# Patient Record
Sex: Female | Born: 1980 | Race: White | Hispanic: No | Marital: Married | Smoking: Former smoker
Health system: Southern US, Community
[De-identification: ages and names within clinical notes are randomized; demographics above are authoritative.]

## PROBLEM LIST (undated history)

## (undated) ENCOUNTER — Inpatient Hospital Stay (HOSPITAL_COMMUNITY): Payer: Self-pay

## (undated) DIAGNOSIS — O30043 Twin pregnancy, dichorionic/diamniotic, third trimester: Secondary | ICD-10-CM

## (undated) DIAGNOSIS — Z9289 Personal history of other medical treatment: Secondary | ICD-10-CM

## (undated) DIAGNOSIS — K219 Gastro-esophageal reflux disease without esophagitis: Secondary | ICD-10-CM

## (undated) DIAGNOSIS — Z8659 Personal history of other mental and behavioral disorders: Secondary | ICD-10-CM

## (undated) DIAGNOSIS — O4290 Premature rupture of membranes, unspecified as to length of time between rupture and onset of labor, unspecified weeks of gestation: Secondary | ICD-10-CM

## (undated) DIAGNOSIS — J45909 Unspecified asthma, uncomplicated: Secondary | ICD-10-CM

## (undated) DIAGNOSIS — R112 Nausea with vomiting, unspecified: Secondary | ICD-10-CM

## (undated) DIAGNOSIS — Z973 Presence of spectacles and contact lenses: Secondary | ICD-10-CM

## (undated) DIAGNOSIS — F319 Bipolar disorder, unspecified: Secondary | ICD-10-CM

## (undated) DIAGNOSIS — Z87442 Personal history of urinary calculi: Secondary | ICD-10-CM

## (undated) DIAGNOSIS — F419 Anxiety disorder, unspecified: Secondary | ICD-10-CM

## (undated) DIAGNOSIS — O34219 Maternal care for unspecified type scar from previous cesarean delivery: Secondary | ICD-10-CM

## (undated) DIAGNOSIS — Z9889 Other specified postprocedural states: Secondary | ICD-10-CM

---

## 1998-01-23 ENCOUNTER — Encounter: Payer: Self-pay | Admitting: Emergency Medicine

## 1998-01-23 ENCOUNTER — Emergency Department (HOSPITAL_COMMUNITY): Admission: EM | Admit: 1998-01-23 | Discharge: 1998-01-23 | Payer: Self-pay | Admitting: Emergency Medicine

## 1998-03-18 ENCOUNTER — Inpatient Hospital Stay (HOSPITAL_COMMUNITY): Admission: EM | Admit: 1998-03-18 | Discharge: 1998-03-21 | Payer: Self-pay | Admitting: Emergency Medicine

## 1998-03-18 ENCOUNTER — Encounter: Payer: Self-pay | Admitting: Family Medicine

## 1998-03-19 ENCOUNTER — Encounter: Payer: Self-pay | Admitting: Family Medicine

## 1998-03-20 ENCOUNTER — Encounter: Payer: Self-pay | Admitting: Family Medicine

## 1998-03-25 ENCOUNTER — Encounter: Admission: RE | Admit: 1998-03-25 | Discharge: 1998-03-25 | Payer: Self-pay | Admitting: Family Medicine

## 1998-03-27 ENCOUNTER — Encounter: Payer: Self-pay | Admitting: *Deleted

## 1998-03-27 ENCOUNTER — Ambulatory Visit (HOSPITAL_COMMUNITY): Admission: RE | Admit: 1998-03-27 | Discharge: 1998-03-27 | Payer: Self-pay | Admitting: *Deleted

## 1999-07-29 ENCOUNTER — Encounter: Payer: Self-pay | Admitting: Orthodontics and Dentofacial Orthopedics

## 1999-07-29 ENCOUNTER — Ambulatory Visit (HOSPITAL_COMMUNITY)
Admission: RE | Admit: 1999-07-29 | Discharge: 1999-07-29 | Payer: Self-pay | Admitting: Orthodontics and Dentofacial Orthopedics

## 2000-06-02 ENCOUNTER — Encounter: Admission: RE | Admit: 2000-06-02 | Discharge: 2000-08-31 | Payer: Self-pay

## 2000-06-15 ENCOUNTER — Encounter: Admission: RE | Admit: 2000-06-15 | Discharge: 2000-06-15 | Payer: Self-pay

## 2001-12-23 ENCOUNTER — Emergency Department (HOSPITAL_COMMUNITY): Admission: EM | Admit: 2001-12-23 | Discharge: 2001-12-24 | Payer: Self-pay | Admitting: *Deleted

## 2002-02-10 ENCOUNTER — Encounter: Payer: Self-pay | Admitting: Emergency Medicine

## 2002-02-10 ENCOUNTER — Emergency Department (HOSPITAL_COMMUNITY): Admission: EM | Admit: 2002-02-10 | Discharge: 2002-02-10 | Payer: Self-pay | Admitting: Emergency Medicine

## 2002-02-11 ENCOUNTER — Inpatient Hospital Stay (HOSPITAL_COMMUNITY): Admission: EM | Admit: 2002-02-11 | Discharge: 2002-02-13 | Payer: Self-pay | Admitting: Psychiatry

## 2002-05-25 ENCOUNTER — Other Ambulatory Visit: Admission: RE | Admit: 2002-05-25 | Discharge: 2002-05-25 | Payer: Self-pay | Admitting: Obstetrics and Gynecology

## 2002-07-20 ENCOUNTER — Other Ambulatory Visit: Admission: RE | Admit: 2002-07-20 | Discharge: 2002-07-20 | Payer: Self-pay | Admitting: Obstetrics and Gynecology

## 2002-08-15 ENCOUNTER — Ambulatory Visit (HOSPITAL_COMMUNITY): Admission: RE | Admit: 2002-08-15 | Discharge: 2002-08-15 | Payer: Self-pay | Admitting: Obstetrics and Gynecology

## 2002-08-15 HISTORY — PX: OTHER SURGICAL HISTORY: SHX169

## 2002-09-29 ENCOUNTER — Encounter: Payer: Self-pay | Admitting: Emergency Medicine

## 2002-09-29 ENCOUNTER — Emergency Department (HOSPITAL_COMMUNITY): Admission: EM | Admit: 2002-09-29 | Discharge: 2002-09-29 | Payer: Self-pay | Admitting: Emergency Medicine

## 2002-12-28 ENCOUNTER — Other Ambulatory Visit: Admission: RE | Admit: 2002-12-28 | Discharge: 2002-12-28 | Payer: Self-pay | Admitting: Obstetrics and Gynecology

## 2003-06-22 ENCOUNTER — Other Ambulatory Visit: Admission: RE | Admit: 2003-06-22 | Discharge: 2003-06-22 | Payer: Self-pay | Admitting: Obstetrics and Gynecology

## 2004-10-08 ENCOUNTER — Inpatient Hospital Stay (HOSPITAL_COMMUNITY): Admission: RE | Admit: 2004-10-08 | Discharge: 2004-10-16 | Payer: Self-pay | Admitting: Psychiatry

## 2004-10-08 ENCOUNTER — Ambulatory Visit: Payer: Self-pay | Admitting: Psychiatry

## 2004-10-17 ENCOUNTER — Other Ambulatory Visit (HOSPITAL_COMMUNITY): Admission: RE | Admit: 2004-10-17 | Discharge: 2004-11-04 | Payer: Self-pay | Admitting: Psychiatry

## 2009-02-23 HISTORY — PX: TONSILLECTOMY: SUR1361

## 2009-03-29 ENCOUNTER — Ambulatory Visit (HOSPITAL_COMMUNITY): Admission: RE | Admit: 2009-03-29 | Discharge: 2009-03-29 | Payer: Self-pay | Admitting: Obstetrics and Gynecology

## 2010-03-17 LAB — CBC
Hemoglobin: 13.9 g/dL (ref 12.0–15.0)
MCH: 31.6 pg (ref 26.0–34.0)
MCHC: 34.2 g/dL (ref 30.0–36.0)
RDW: 12.4 % (ref 11.5–15.5)

## 2010-03-21 ENCOUNTER — Ambulatory Visit (HOSPITAL_COMMUNITY)
Admission: RE | Admit: 2010-03-21 | Discharge: 2010-03-21 | Payer: Self-pay | Source: Home / Self Care | Attending: Obstetrics and Gynecology | Admitting: Obstetrics and Gynecology

## 2010-03-21 ENCOUNTER — Emergency Department (HOSPITAL_BASED_OUTPATIENT_CLINIC_OR_DEPARTMENT_OTHER)
Admission: EM | Admit: 2010-03-21 | Discharge: 2010-03-21 | Payer: Self-pay | Source: Home / Self Care | Admitting: Emergency Medicine

## 2010-03-21 HISTORY — PX: OTHER SURGICAL HISTORY: SHX169

## 2010-03-21 LAB — URINALYSIS, ROUTINE W REFLEX MICROSCOPIC
Bilirubin Urine: NEGATIVE
Ketones, ur: 15 mg/dL — AB
Protein, ur: NEGATIVE mg/dL
Urine Glucose, Fasting: >1000 mg/dL — AB
Urobilinogen, UA: 0.2 mg/dL (ref 0.0–1.0)
pH: 6 (ref 5.0–8.0)

## 2010-03-21 LAB — URINE MICROSCOPIC-ADD ON

## 2010-03-21 LAB — HCG, SERUM, QUALITATIVE: Preg, Serum: NEGATIVE

## 2010-05-09 NOTE — Consult Note (Addendum)
Tracy Harris, RINKE           ACCOUNT NO.:  [id number]  MEDICAL RECORD NO.:  [id number]          PATIENT TYPE:  EMS  LOCATION:  ED                            FACILITY:  [facility]  PHYSICIAN:  Fermin Schwab, MD   DATE OF BIRTH:  1980/05/12  DATE OF CONSULTATION:  03/22/2010 DATE OF DISCHARGE:  03/21/2010                                CONSULTATION   PREOPERATIVE DIAGNOSES:  Left ovarian cyst, probable endometriosis of left ovary.  POSTOPERATIVE DIAGNOSES:  Endometriosis of ovaries (stage II to III, depending on whether or not the endometriotic nature of the left ovarian cyst is confirmed by histopathology), endometriosis of peritoneum and/or sigmoid colon.  PROCEDURES:  Laparoscopy, lysis of adhesions and extensive excision and ablation of endometriotic lesions.  Left ovarian cyst wall biopsy.  SURGEON:  Fermin Schwab, MD  ANESTHESIA:  General endotracheal.  FINDINGS:  On exam under anesthesia, I did not feel any cul-de-sac, induration or pelvic nodularity.  The uterus was normal size, anteverted.  The left ovary was prominent.  No other adnexal masses were palpable.  On laparoscopy, the liver and gallbladder appeared normal. The appendix was normal.  The diaphragm surfaces were normal.  There were filmy adhesions in the anterior cul-de-sac from endometriosis.  The left ovary had filmy adhesions covering less than one-tenth of the ovary to the pelvic side wall.  There were diffuse superficial endometriotic implants in all stages, in all forms (stellate fibrotic lesions, brown lesions).  Clear vesicals and polypoid lesions.  On the entire posterior cul-de-sac being more than in the anterior cul-de-sac and in both ovarian fossae.  These lesions were also present in the posterior lateral aspect of the pelvis along the infundibulopelvic ligaments as well.  There was an additional stellate lesion in the right iliac fossa. Surface of the endometriosis was also covered with 5  or 10 lesions on the left and there was 5 or 6 red polypoid lesions as well as a 0.5-cm endometrioma in the right ovary.  The fallopian tubes were entirely normal externally and the fimbriae were rated 5/5.  There was patency to the fallopian tube on chromotubation, although the right tube did not feel (technical problem?Marland Kitchen)  The left ovary contained a follicular cyst. Upon, there was no endometrioma pointing on inspection.  When I made an incision in the medial inferior aspect of the left ovary, I entered a luteal cyst that had the thin brownish fluid content which did not resemble an endometrioma.  Biopsy was taken from this area.  The ultrasonographic visualized cyst was not encountered during laparoscopy. There was a sigmoid polypoid lesion about 6-cm from the bottom of the cul-de-sac.  DESCRIPTION OF THE PROCEDURE:  The patient was placed in dorsal supine position.  General endotracheal anesthesia was given, 1 g of cefazolin was given intravenously for prophylaxis.  She was prepped and draped in sterile manner.  A ZUMI catheter was inserted into the uterus.  The uterus sounded to 7-cm.  A bladder catheter was inserted.  A intraumbilical skin incision was made after preoperative anesthesia with 0.25% Marcaine with epinephrine.  Pneumocranium was created with CO2 via a Veress needle.  Two  other lower quadrant incisions were made under direct visualization to accommodate the ancillary reports.  Video laparoscopy was started 30-degree, 5-mm laparoscope.  Above findings were noted.  A needle electrode was used to excise and ablate all of the lesions.  The cutting current of 35 watts was used.  The hemostasis was ensured.  Pelvis was vigorously irrigated and aspirated.  A slurring of Seprafilm was used to coat the peritoneal surfaces at the end of the procedure as an adhesion barrier.  The instruments were removed and instrument was correct.  The gas was allowed to escape.  Incisions  were approximated with Dermabond.  I will plan a postop fluoroscopic tubal catheterization on the right tube if the patient does not conceive in a few months.  The patient's estimated blood loss was 10 mL.     Fermin Schwab, MD     TY/MEDQ  D:  03/22/2010  T:  03/23/2010  Job:  [id number]  cc:   Maxie Better, M.D. Fax: [phone]  Electronically Signed by Fermin Schwab MD on 05/09/2010 05:28:03 PM

## 2010-05-28 ENCOUNTER — Other Ambulatory Visit: Payer: Self-pay | Admitting: Obstetrics and Gynecology

## 2010-06-25 ENCOUNTER — Other Ambulatory Visit: Payer: Self-pay | Admitting: Dermatology

## 2010-07-11 NOTE — H&P (Signed)
   NAME:  Tracy Harris, Tracy Harris                   ACCOUNT NO.:  [id number]   MEDICAL RECORD NO.:  [id number]                   PATIENT TYPE:  AMB   LOCATION:  SDC                                  [facility]ACILITY:  [facility]   PHYSICIAN:  Lenoard Aden, M.D.             DATE OF BIRTH:  1980-08-23   DATE OF ADMISSION:  08/15/2002  DATE OF DISCHARGE:                                HISTORY & PHYSICAL   CHIEF COMPLAINT:  Multifocal HPV.   HISTORY OF PRESENT ILLNESS:  The patient is a 30 year old white female,  gravida 0, para 0, with a history of recurrent HPV, who presents with a  known abnormal Pap smear consistent with HPV-related dysplasia and  multifocal vulvovaginal HPV for CO2 laser ablation.   PAST MEDICAL HISTORY:  Remarkable for urolithiasis, anorexia, and  pyelonephritis.   MEDICATIONS:  Lexapro, Clonazepam, and Seroquel.   ALLERGIES:  No known drug allergies.   FAMILY HISTORY:  Hypertension and heart disease.   SOCIAL HISTORY:  She is a smoker. She denies domestic or physical violence.   PHYSICAL EXAMINATION:  GENERAL: She is a well-developed, well-nourished,  white female in no acute distress.  HEENT:  Normal.  LUNGS:  Clear.  HEART: Regular rhythm.  ABDOMEN:  Soft and nontender.  PELVIC:  Normal size uterus and no adnexal masses.   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   PLAN:  To proceed with vulvovaginal laser and CO2 laser ablation.  Risks of  anesthesia, infection, bleeding, postoperative pain, possibility for  recurrence discussed. The patient acknowledges and wishes to proceed.                                               Lenoard Aden, M.D.    RJT/MEDQ  D:  08/14/2002  T:  08/14/2002  Job:  [id number]
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
## 2010-07-11 NOTE — Op Note (Signed)
NAME:  Tracy Harris, Tracy Harris                   ACCOUNT NO.:  [id number]   MEDICAL RECORD NO.:  [id number]                   PATIENT TYPE:  AMB   LOCATION:  SDC                                  [facility]ILITY[facility]   PHYSICIAN:  Lenoard Aden, M.D.             DATE OF BIRTH:  1980-08-23   DATE OF PROCEDURE:  08/15/2002  DATE OF DISCHARGE:                                 OPERATIVE REPORT   PREOPERATIVE DIAGNOSES:  Symptomatic human papilloma virus, vulvovaginal  human papilloma virus, cervical human papilloma virus.   POSTOPERATIVE DIAGNOSES:  Symptomatic human papilloma virus, vulvovaginal  human papilloma virus, cervical human papilloma virus.   PROCEDURE:  CO2 laser ablation of HPV in the perineal area, vulvar,  perirectal area, cervix and vagina.   SURGEON:  Lenoard Aden, M.D.   ANESTHESIA:  General.   ESTIMATED BLOOD LOSS:  Less than 50 cc.   COMPLICATIONS:  None.   DRAINS:  None.   COUNTS:  Correct.   DISPOSITION:  The patient sent to recovery room in good condition.   BRIEF OPERATIVE NOTE:  After being apprised of the risks of anesthesia,  infection, bleeding, inability to eradicate all HPV, the patient is brought  to the operating room where she was administered general anesthetic without  complications.  She was prepped and draped in the usual sterile fashion.  Catheterized until the bladder was empty.  After achieving adequate  anesthesia, bivalve speculum was placed.  The cervix was coated using aceto  white acid.  Colposcopy identifies HPV-related dysplastic changes in the  lateral vaginal fornices and in the transformation zone, which are ablated  down to level 5 mm at the level of the cervix using 15 watts of CO2 laser  ablation.  Then down to 3 mm in the lateral vaginal fornices; specifically,  between 2 and 4 o'clock in the left lateral fornix.  There is some distal  vaginal HPV, which was then ablated using the CO2 laser down to a level of 3  mm at  the level behind luminal opening and slightly distal to that area.  Perirectal HPV is the noted.  Rectum is packed using a moist 4 x 4, and  these perirectal HPV are ablated down to the level of the rectal mucosa.  Good hemostasis is noted.  There are multifocal HPV in this perirectal area,  in addition to along the posterior fourchet -- which are CO2 laser ablated,  with a larger area along the right area of the posterior fourchet.  Silvadene sulfamoxole solution is  placed in the cervix and vaginal fornices, and Silvadene cream is placed to  the external perineal and distal vaginal operative areas, as well as to the  perirectal area.  The patient tolerated the procedure well and was awakened,  transferred to recovery room in good condition.  Lenoard Aden, M.D.    RJT/MEDQ  D:  08/15/2002  T:  08/16/2002  Job:  [id number]
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
## 2010-07-15 LAB — RUBELLA ANTIBODY, IGM: Rubella: UNDETERMINED

## 2010-07-15 LAB — ANTIBODY SCREEN: Antibody Screen: NEGATIVE

## 2010-07-15 LAB — RPR: RPR: NONREACTIVE

## 2010-07-15 LAB — HEPATITIS B SURFACE ANTIGEN: Hepatitis B Surface Ag: NEGATIVE

## 2010-07-24 ENCOUNTER — Other Ambulatory Visit: Payer: Self-pay | Admitting: Obstetrics and Gynecology

## 2010-08-24 DIAGNOSIS — F319 Bipolar disorder, unspecified: Secondary | ICD-10-CM | POA: Insufficient documentation

## 2010-09-29 ENCOUNTER — Encounter: Payer: BC Managed Care – PPO | Attending: Obstetrics and Gynecology | Admitting: *Deleted

## 2010-09-29 ENCOUNTER — Encounter: Payer: Self-pay | Admitting: *Deleted

## 2010-09-29 DIAGNOSIS — Z713 Dietary counseling and surveillance: Secondary | ICD-10-CM | POA: Insufficient documentation

## 2010-09-29 DIAGNOSIS — E162 Hypoglycemia, unspecified: Secondary | ICD-10-CM | POA: Insufficient documentation

## 2010-09-29 DIAGNOSIS — O99891 Other specified diseases and conditions complicating pregnancy: Secondary | ICD-10-CM | POA: Insufficient documentation

## 2010-09-29 NOTE — Progress Notes (Signed)
  Medical Nutrition Therapy:  Appt start time: 0900 end time:  1000.  Assessment:  Primary concerns today: Hypoglycemia in Pregnancy; Nausea.  Pt (5 mo pregnant) with hypoglycemic episodes here for nutritional counseling. Pre-pregnancy UBW was 120 lbs; current wt gain = ~18 lbs. Reports extreme nausea in morning and night. Pt eats every 2 hours, and has to get up at 1-1:30 am to eat or nausea is worse in morning.  Dietary intake includes adequate amounts/types of protein, fat, and CHO.  Dietary recall shows breakfast and many snacks of only CHO or that include very little protein, likely causing hypoglycemic episodes and aggravate nausea.   MEDICATIONS: Wellbutrin, Seroquel, Ambien, prenatal MVI, folic acid   DIETARY INTAKE:  Meal or snack every 2-2.5 hours. 24-hr recall:  B (AM): Honey Nut Cheerios/soy milk or oatmeal w/ peanut butter on english muffin Snk (AM): Chobani w/ fruit, [place] dark choc/almond protein bar  L (PM): [place] salad, pizza Snk (PM): apple or banana w/ peanut butter D (PM): not reported Snk (HS): Chobani w/ fruit Snk (1am): apple w/ peanut butter    Usual physical activity:  Minimal d/t nausea.  Estimated energy needs: Lower range - 2nd trimester; Higher range - 3rd trimester 1600-1800 calories 200-225 g carbohydrates 80-90 g protein 50-60 g fat  Progress Towards Goal(s):  NEW   Nutritional Diagnosis:  I-1.4 Inadequate energy intake related to pregnancy as evidenced by hypoglycemic episodes and nausea..    Intervention/Goals:  Increase calorie intake to 1600-1800 daily and continue prenatal vitamin as instructed by MD.    Eat 5-6 times daily, or every 2-2.5 hrs.  Aim for 30-45 grams carbohydrate/meal and 30 grams carbohydrate/snack.  Increase fiber/lean protein with meals/snacks and limit simple sugars/concentrated sweets (<10g sugar per serving) to help prevent low blood sugar (hypoglycemia).  Aim for physical activity as able per MD  approval.  Contact me if you have any questions or if hypoglycemic episodes continue.   Monitoring/Evaluation:  Dietary intake, exercise, hypoglycemic episodes, and body weight prn.

## 2010-10-05 NOTE — Patient Instructions (Signed)
Goals:  Increase calorie intake to 1600-1800 daily and continue prenatal vitamin as instructed by MD.    Eat 5-6 times daily, or every 2-2.5 hrs.  Aim for 30-45 grams carbohydrate/meal and 30 grams carbohydrate/snack.  Increase fiber/lean protein with meals/snacks and limit simple sugars/concentrated sweets (<10g sugar per serving) to help prevent low blood sugar (hypoglycemia).  Aim for physical activity as able per MD approval.  Contact me if you have any questions or if hypoglycemic episodes continue.

## 2011-02-02 ENCOUNTER — Encounter (HOSPITAL_COMMUNITY): Payer: Self-pay | Admitting: Pharmacist

## 2011-02-03 ENCOUNTER — Other Ambulatory Visit: Payer: Self-pay | Admitting: Obstetrics and Gynecology

## 2011-02-05 ENCOUNTER — Encounter (HOSPITAL_COMMUNITY): Payer: Self-pay

## 2011-02-06 ENCOUNTER — Encounter (HOSPITAL_COMMUNITY): Payer: Self-pay

## 2011-02-06 ENCOUNTER — Encounter (HOSPITAL_COMMUNITY)
Admission: RE | Admit: 2011-02-06 | Discharge: 2011-02-06 | Disposition: A | Discharge status: Home / Self Care | Payer: BC Managed Care – PPO | Source: Ambulatory Visit | Attending: Obstetrics and Gynecology | Admitting: Obstetrics and Gynecology

## 2011-02-06 HISTORY — DX: Gastro-esophageal reflux disease without esophagitis: K21.9

## 2011-02-06 HISTORY — DX: Anxiety disorder, unspecified: F41.9

## 2011-02-06 LAB — CBC
HCT: 36.8 % (ref 36.0–46.0)
Hemoglobin: 12.2 g/dL (ref 12.0–15.0)
MCH: 31.2 pg (ref 26.0–34.0)
MCHC: 33.2 g/dL (ref 30.0–36.0)
MCV: 94.1 fL (ref 78.0–100.0)
Platelets: 184 10*3/uL (ref 150–400)
RBC: 3.91 MIL/uL (ref 3.87–5.11)
RDW: 13.2 % (ref 11.5–15.5)
WBC: 7.7 10*3/uL (ref 4.0–10.5)

## 2011-02-06 LAB — SURGICAL PCR SCREEN
MRSA, PCR: NEGATIVE
Staphylococcus aureus: NEGATIVE

## 2011-02-06 LAB — SYPHILIS: RPR W/REFLEX TO RPR TITER AND TREPONEMAL ANTIBODIES, TRADITIONAL SCREENING AND DIAGNOSIS ALGORITHM: RPR Ser Ql: NONREACTIVE

## 2011-02-06 NOTE — Pre-Procedure Instructions (Signed)
Patient cannot breast feed due to depression meds she is on.

## 2011-02-06 NOTE — Patient Instructions (Signed)
YOUR PROCEDURE IS SCHEDULED ON:02/11/11  ENTER THROUGH THE MAIN ENTRANCE OF [facility] AT:1200pm USE DESK PHONE AND DIAL [phone] TO INFORM us OF YOUR ARRIVAL  CALL [phone] IF YOU HAVE ANY QUESTIONS OR PROBLEMS PRIOR TO YOUR ARRIVAL.  REMEMBER: DO NOT EAT AFTER MIDNIGHT :Tuesday  SPECIAL INSTRUCTIONS:clear liquids ok until 9:30 am    YOU MAY BRUSH YOUR TEETH THE MORNING OF SURGERY   TAKE THESE MEDICINES THE DAY OF SURGERY WITH SIP OF WATER:usual am meds   DO NOT WEAR JEWELRY, EYE MAKEUP, LIPSTICK OR DARK FINGERNAIL POLISH DO NOT WEAR LOTIONS OR DEODORANT DO NOT SHAVE FOR 48 HOURS PRIOR TO SURGERY  YOU WILL NOT BE ALLOWED TO DRIVE YOURSELF HOME.  NAME OF DRIVER:Jeff

## 2011-02-10 MED ORDER — CEFAZOLIN SODIUM-DEXTROSE 2-3 GM-% IV SOLR
2.0000 g | INTRAVENOUS | Status: AC
  Administered 2011-02-11: 2 g via INTRAVENOUS
  Filled 2011-02-10: qty 50

## 2011-02-11 ENCOUNTER — Encounter (HOSPITAL_COMMUNITY): Payer: Self-pay | Admitting: Anesthesiology

## 2011-02-11 ENCOUNTER — Inpatient Hospital Stay (HOSPITAL_COMMUNITY): Payer: BC Managed Care – PPO | Admitting: Anesthesiology

## 2011-02-11 ENCOUNTER — Encounter (HOSPITAL_COMMUNITY): Payer: Self-pay | Admitting: *Deleted

## 2011-02-11 ENCOUNTER — Encounter (HOSPITAL_COMMUNITY): Payer: Self-pay | Admitting: Emergency Medicine

## 2011-02-11 ENCOUNTER — Encounter (HOSPITAL_COMMUNITY)
Admission: AD | Disposition: A | Discharge status: Home / Self Care | Payer: Self-pay | Source: Ambulatory Visit | Attending: Obstetrics and Gynecology

## 2011-02-11 ENCOUNTER — Inpatient Hospital Stay (HOSPITAL_COMMUNITY)
Admission: AD | Admit: 2011-02-11 | Discharge: 2011-02-14 | DRG: 371 | Disposition: A | Discharge status: Home / Self Care | Payer: BC Managed Care – PPO | Source: Ambulatory Visit | Attending: Obstetrics and Gynecology | Admitting: Obstetrics and Gynecology

## 2011-02-11 DIAGNOSIS — O99344 Other mental disorders complicating childbirth: Secondary | ICD-10-CM | POA: Diagnosis present

## 2011-02-11 DIAGNOSIS — F319 Bipolar disorder, unspecified: Secondary | ICD-10-CM | POA: Diagnosis present

## 2011-02-11 DIAGNOSIS — O321XX0 Maternal care for breech presentation, not applicable or unspecified: Principal | ICD-10-CM | POA: Diagnosis present

## 2011-02-11 SURGERY — Surgical Case
Anesthesia: Spinal | Site: Uterus | Wound class: Clean Contaminated

## 2011-02-11 MED ORDER — DIBUCAINE 1 % RE OINT: 1.0000 "application " | TOPICAL_OINTMENT | RECTAL | Status: DC | PRN

## 2011-02-11 MED ORDER — OXYTOCIN 20 UNITS IN LACTATED RINGERS INFUSION - SIMPLE: 125.0000 mL/h | INTRAVENOUS | Status: AC

## 2011-02-11 MED ORDER — SODIUM CHLORIDE 0.9 % IV SOLN: 1.0000 ug/kg/h | INTRAVENOUS | Status: DC | PRN

## 2011-02-11 MED ORDER — DIPHENHYDRAMINE HCL 25 MG PO CAPS: 25.0000 mg | ORAL_CAPSULE | ORAL | Status: DC | PRN

## 2011-02-11 MED ORDER — NALBUPHINE HCL 10 MG/ML IJ SOLN: 5.0000 mg | INTRAMUSCULAR | Status: DC | PRN

## 2011-02-11 MED ORDER — KETOROLAC TROMETHAMINE 30 MG/ML IJ SOLN: 30.0000 mg | Freq: Four times a day (QID) | INTRAMUSCULAR | Status: AC | PRN

## 2011-02-11 MED ORDER — ZOLPIDEM TARTRATE 5 MG PO TABS
5.0000 mg | ORAL_TABLET | Freq: Every evening | ORAL | Status: DC | PRN
  Administered 2011-02-12: 5 mg via ORAL
  Filled 2011-02-11: qty 1

## 2011-02-11 MED ORDER — PHENYLEPHRINE 40 MCG/ML (10ML) SYRINGE FOR IV PUSH (FOR BLOOD PRESSURE SUPPORT)
PREFILLED_SYRINGE | INTRAVENOUS | Status: AC
  Filled 2011-02-11: qty 5

## 2011-02-11 MED ORDER — SODIUM CHLORIDE 0.9 % IJ SOLN: 3.0000 mL | Freq: Two times a day (BID) | INTRAMUSCULAR | Status: DC

## 2011-02-11 MED ORDER — SODIUM CHLORIDE 0.9 % IJ SOLN: 3.0000 mL | INTRAMUSCULAR | Status: DC | PRN

## 2011-02-11 MED ORDER — MEPERIDINE HCL 25 MG/ML IJ SOLN: 6.2500 mg | INTRAMUSCULAR | Status: DC | PRN

## 2011-02-11 MED ORDER — MORPHINE SULFATE (PF) 0.5 MG/ML IJ SOLN
INTRAMUSCULAR | Status: DC | PRN
  Administered 2011-02-11: .1 mg via INTRATHECAL

## 2011-02-11 MED ORDER — FLEET ENEMA 7-19 GM/118ML RE ENEM: 1.0000 | ENEMA | Freq: Every day | RECTAL | Status: DC | PRN

## 2011-02-11 MED ORDER — SCOPOLAMINE 1 MG/3DAYS TD PT72
1.0000 | MEDICATED_PATCH | Freq: Once | TRANSDERMAL | Status: DC
  Administered 2011-02-11: 1.5 mg via TRANSDERMAL

## 2011-02-11 MED ORDER — SCOPOLAMINE 1 MG/3DAYS TD PT72: 1.0000 | MEDICATED_PATCH | Freq: Once | TRANSDERMAL | Status: DC

## 2011-02-11 MED ORDER — BISACODYL 10 MG RE SUPP: 10.0000 mg | Freq: Every day | RECTAL | Status: DC | PRN

## 2011-02-11 MED ORDER — NALOXONE HCL 0.4 MG/ML IJ SOLN: 0.4000 mg | INTRAMUSCULAR | Status: DC | PRN

## 2011-02-11 MED ORDER — ONDANSETRON HCL 4 MG/2ML IJ SOLN: 4.0000 mg | Freq: Four times a day (QID) | INTRAMUSCULAR | Status: DC | PRN

## 2011-02-11 MED ORDER — SODIUM CHLORIDE 0.9 % IV SOLN: 250.0000 mL | INTRAVENOUS | Status: DC | PRN

## 2011-02-11 MED ORDER — SENNOSIDES-DOCUSATE SODIUM 8.6-50 MG PO TABS
2.0000 | ORAL_TABLET | Freq: Every day | ORAL | Status: DC
  Administered 2011-02-11 – 2011-02-13 (×3): 2 via ORAL

## 2011-02-11 MED ORDER — LACTATED RINGERS IV SOLN
INTRAVENOUS | Status: DC
  Administered 2011-02-11 (×2): via INTRAVENOUS

## 2011-02-11 MED ORDER — SODIUM CHLORIDE 0.9 % IJ SOLN: 9.0000 mL | INTRAMUSCULAR | Status: DC | PRN

## 2011-02-11 MED ORDER — HYDROMORPHONE 0.3 MG/ML IV SOLN
INTRAVENOUS | Status: DC
  Administered 2011-02-11: 18:00:00 via INTRAVENOUS
  Administered 2011-02-12: 1.39 mg via INTRAVENOUS
  Administered 2011-02-12: 1.99 mg via INTRAVENOUS
  Filled 2011-02-11: qty 25

## 2011-02-11 MED ORDER — FENTANYL CITRATE 0.05 MG/ML IJ SOLN
INTRAMUSCULAR | Status: AC
  Administered 2011-02-11: 50 ug via INTRAVENOUS
  Filled 2011-02-11: qty 2

## 2011-02-11 MED ORDER — OXYTOCIN 20 UNITS IN LACTATED RINGERS INFUSION - SIMPLE
INTRAVENOUS | Status: AC
  Administered 2011-02-11: 20 [IU] via INTRAVENOUS
  Filled 2011-02-11: qty 1000

## 2011-02-11 MED ORDER — OXYCODONE-ACETAMINOPHEN 5-325 MG PO TABS
1.0000 | ORAL_TABLET | ORAL | Status: DC | PRN
  Administered 2011-02-12 (×2): 2 via ORAL
  Administered 2011-02-12 – 2011-02-13 (×3): 1 via ORAL
  Administered 2011-02-13: 2 via ORAL
  Administered 2011-02-13 (×3): 1 via ORAL
  Administered 2011-02-13: 2 via ORAL
  Administered 2011-02-14 (×2): 1 via ORAL
  Filled 2011-02-11 (×2): qty 1
  Filled 2011-02-11: qty 2
  Filled 2011-02-11 (×7): qty 1
  Filled 2011-02-11: qty 2
  Filled 2011-02-11 (×3): qty 1

## 2011-02-11 MED ORDER — ONDANSETRON HCL 4 MG/2ML IJ SOLN
INTRAMUSCULAR | Status: DC | PRN
  Administered 2011-02-11: 4 mg via INTRAVENOUS

## 2011-02-11 MED ORDER — BUPIVACAINE IN DEXTROSE 0.75-8.25 % IT SOLN
INTRATHECAL | Status: DC | PRN
  Administered 2011-02-11: 11.75 mg via INTRATHECAL

## 2011-02-11 MED ORDER — FENTANYL CITRATE 0.05 MG/ML IJ SOLN
INTRAMUSCULAR | Status: DC | PRN
  Administered 2011-02-11: 15 ug via INTRATHECAL

## 2011-02-11 MED ORDER — MENTHOL 3 MG MT LOZG: 1.0000 | LOZENGE | OROMUCOSAL | Status: DC | PRN

## 2011-02-11 MED ORDER — METOCLOPRAMIDE HCL 5 MG/ML IJ SOLN: 10.0000 mg | Freq: Three times a day (TID) | INTRAMUSCULAR | Status: DC | PRN

## 2011-02-11 MED ORDER — MORPHINE SULFATE 0.5 MG/ML IJ SOLN
INTRAMUSCULAR | Status: AC
  Filled 2011-02-11: qty 10

## 2011-02-11 MED ORDER — LANOLIN HYDROUS EX OINT: 1.0000 "application " | TOPICAL_OINTMENT | CUTANEOUS | Status: DC | PRN

## 2011-02-11 MED ORDER — DIPHENHYDRAMINE HCL 12.5 MG/5ML PO ELIX: 12.5000 mg | ORAL_SOLUTION | Freq: Four times a day (QID) | ORAL | Status: DC | PRN

## 2011-02-11 MED ORDER — DIPHENHYDRAMINE HCL 50 MG/ML IJ SOLN: 25.0000 mg | INTRAMUSCULAR | Status: DC | PRN

## 2011-02-11 MED ORDER — DIPHENHYDRAMINE HCL 50 MG/ML IJ SOLN: 12.5000 mg | Freq: Four times a day (QID) | INTRAMUSCULAR | Status: DC | PRN

## 2011-02-11 MED ORDER — FENTANYL CITRATE 0.05 MG/ML IJ SOLN
25.0000 ug | INTRAMUSCULAR | Status: DC | PRN
  Administered 2011-02-11 (×2): 50 ug via INTRAVENOUS

## 2011-02-11 MED ORDER — OXYTOCIN 20 UNITS IN LACTATED RINGERS INFUSION - SIMPLE
INTRAVENOUS | Status: DC | PRN
  Administered 2011-02-11: 20 [IU] via INTRAVENOUS

## 2011-02-11 MED ORDER — SIMETHICONE 80 MG PO CHEW: 80.0000 mg | CHEWABLE_TABLET | ORAL | Status: DC | PRN

## 2011-02-11 MED ORDER — OXYTOCIN 10 UNIT/ML IJ SOLN
INTRAMUSCULAR | Status: AC
  Filled 2011-02-11: qty 2

## 2011-02-11 MED ORDER — TETANUS-DIPHTH-ACELL PERTUSSIS 5-2.5-18.5 LF-MCG/0.5 IM SUSP
0.5000 mL | Freq: Once | INTRAMUSCULAR | Status: AC
  Administered 2011-02-12: 0.5 mL via INTRAMUSCULAR
  Filled 2011-02-11: qty 0.5

## 2011-02-11 MED ORDER — KETOROLAC TROMETHAMINE 60 MG/2ML IM SOLN
60.0000 mg | Freq: Once | INTRAMUSCULAR | Status: AC | PRN
  Administered 2011-02-11: 60 mg via INTRAMUSCULAR

## 2011-02-11 MED ORDER — ONDANSETRON HCL 4 MG/2ML IJ SOLN: 4.0000 mg | INTRAMUSCULAR | Status: DC | PRN

## 2011-02-11 MED ORDER — SIMETHICONE 80 MG PO CHEW
80.0000 mg | CHEWABLE_TABLET | Freq: Three times a day (TID) | ORAL | Status: DC
  Administered 2011-02-11 – 2011-02-14 (×10): 80 mg via ORAL

## 2011-02-11 MED ORDER — BUPIVACAINE HCL (PF) 0.25 % IJ SOLN
INTRAMUSCULAR | Status: DC | PRN
  Administered 2011-02-11: 10 mL

## 2011-02-11 MED ORDER — METHYLERGONOVINE MALEATE 0.2 MG PO TABS: 0.2000 mg | ORAL_TABLET | ORAL | Status: DC | PRN

## 2011-02-11 MED ORDER — ONDANSETRON HCL 4 MG/2ML IJ SOLN: 4.0000 mg | Freq: Three times a day (TID) | INTRAMUSCULAR | Status: DC | PRN

## 2011-02-11 MED ORDER — WITCH HAZEL-GLYCERIN EX PADS: 1.0000 "application " | MEDICATED_PAD | CUTANEOUS | Status: DC | PRN

## 2011-02-11 MED ORDER — ONDANSETRON HCL 4 MG/2ML IJ SOLN
INTRAMUSCULAR | Status: AC
  Filled 2011-02-11: qty 2

## 2011-02-11 MED ORDER — IBUPROFEN 600 MG PO TABS
600.0000 mg | ORAL_TABLET | Freq: Four times a day (QID) | ORAL | Status: DC
  Administered 2011-02-12 – 2011-02-14 (×10): 600 mg via ORAL
  Filled 2011-02-11 (×6): qty 1

## 2011-02-11 MED ORDER — PHENYLEPHRINE HCL 10 MG/ML IJ SOLN
INTRAMUSCULAR | Status: DC | PRN
  Administered 2011-02-11: 80 ug via INTRAVENOUS
  Administered 2011-02-11 (×3): 40 ug via INTRAVENOUS

## 2011-02-11 MED ORDER — DIPHENHYDRAMINE HCL 50 MG/ML IJ SOLN: 12.5000 mg | INTRAMUSCULAR | Status: DC | PRN

## 2011-02-11 MED ORDER — ONDANSETRON HCL 4 MG PO TABS: 4.0000 mg | ORAL_TABLET | ORAL | Status: DC | PRN

## 2011-02-11 MED ORDER — KETOROLAC TROMETHAMINE 60 MG/2ML IM SOLN
INTRAMUSCULAR | Status: AC
  Administered 2011-02-11: 60 mg via INTRAMUSCULAR
  Filled 2011-02-11: qty 2

## 2011-02-11 MED ORDER — IBUPROFEN 600 MG PO TABS
600.0000 mg | ORAL_TABLET | Freq: Four times a day (QID) | ORAL | Status: DC | PRN
  Filled 2011-02-11 (×4): qty 1

## 2011-02-11 MED ORDER — PRENATAL MULTIVITAMIN CH
1.0000 | ORAL_TABLET | Freq: Every day | ORAL | Status: DC
  Administered 2011-02-12 – 2011-02-13 (×2): 1 via ORAL
  Filled 2011-02-11 (×2): qty 1

## 2011-02-11 MED ORDER — DIPHENHYDRAMINE HCL 25 MG PO CAPS: 25.0000 mg | ORAL_CAPSULE | Freq: Four times a day (QID) | ORAL | Status: DC | PRN

## 2011-02-11 MED ORDER — FENTANYL CITRATE 0.05 MG/ML IJ SOLN
INTRAMUSCULAR | Status: AC
  Filled 2011-02-11: qty 2

## 2011-02-11 MED ORDER — METHYLERGONOVINE MALEATE 0.2 MG/ML IJ SOLN: 0.2000 mg | INTRAMUSCULAR | Status: DC | PRN

## 2011-02-11 SURGICAL SUPPLY — 43 items
APL SKNCLS STERI-STRIP NONHPOA (GAUZE/BANDAGES/DRESSINGS)
BENZOIN TINCTURE PRP APPL 2/3 (GAUZE/BANDAGES/DRESSINGS) IMPLANT
CHLORAPREP W/TINT 26ML (MISCELLANEOUS) ×2 IMPLANT
CLOTH BEACON ORANGE TIMEOUT ST (SAFETY) ×2 IMPLANT
CONTAINER PREFILL 10% NBF 15ML (MISCELLANEOUS) IMPLANT
DRESSING TELFA 8X3 (GAUZE/BANDAGES/DRESSINGS) ×2 IMPLANT
DRSG COVADERM 4X10 (GAUZE/BANDAGES/DRESSINGS) ×1 IMPLANT
ELECT REM PT RETURN 9FT ADLT (ELECTROSURGICAL) ×2
ELECTRODE REM PT RTRN 9FT ADLT (ELECTROSURGICAL) ×1 IMPLANT
EXTRACTOR VACUUM KIWI (MISCELLANEOUS) IMPLANT
EXTRACTOR VACUUM M CUP 4 TUBE (SUCTIONS) IMPLANT
GAUZE SPONGE 4X4 12PLY STRL LF (GAUZE/BANDAGES/DRESSINGS) ×4 IMPLANT
GLOVE BIO SURGEON STRL SZ 6.5 (GLOVE) ×2 IMPLANT
GLOVE BIOGEL PI IND STRL 7.0 (GLOVE) ×2 IMPLANT
GLOVE BIOGEL PI INDICATOR 7.0 (GLOVE) ×2
GOWN PREVENTION PLUS LG XLONG (DISPOSABLE) ×6 IMPLANT
KIT ABG SYR 3ML LUER SLIP (SYRINGE) IMPLANT
NDL HYPO 25X1 1.5 SAFETY (NEEDLE) ×1 IMPLANT
NDL HYPO 25X5/8 SAFETYGLIDE (NEEDLE) IMPLANT
NEEDLE HYPO 25X1 1.5 SAFETY (NEEDLE) ×2 IMPLANT
NEEDLE HYPO 25X5/8 SAFETYGLIDE (NEEDLE) IMPLANT
NS IRRIG 1000ML POUR BTL (IV SOLUTION) ×2 IMPLANT
PACK C SECTION WH (CUSTOM PROCEDURE TRAY) ×2 IMPLANT
PAD ABD 7.5X8 STRL (GAUZE/BANDAGES/DRESSINGS) ×2 IMPLANT
RTRCTR C-SECT PINK 25CM LRG (MISCELLANEOUS) IMPLANT
SLEEVE SCD COMPRESS KNEE MED (MISCELLANEOUS) IMPLANT
STAPLER VISISTAT 35W (STAPLE) ×1 IMPLANT
STRIP CLOSURE SKIN 1/2X4 (GAUZE/BANDAGES/DRESSINGS) IMPLANT
SUT CHROMIC GUT AB #0 18 (SUTURE) IMPLANT
SUT MNCRL 0 VIOLET CTX 36 (SUTURE) ×3 IMPLANT
SUT MON AB 4-0 PS1 27 (SUTURE) IMPLANT
SUT MONOCRYL 0 CTX 36 (SUTURE) ×3
SUT PLAIN 2 0 (SUTURE) ×2
SUT PLAIN ABS 2-0 CT1 27XMFL (SUTURE) IMPLANT
SUT VIC AB 0 CT1 27 (SUTURE) ×4
SUT VIC AB 0 CT1 27XBRD ANBCTR (SUTURE) ×2 IMPLANT
SUT VIC AB 2-0 CT1 27 (SUTURE) ×2
SUT VIC AB 2-0 CT1 TAPERPNT 27 (SUTURE) ×1 IMPLANT
SUT VICRYL 0 TIES 12 18 (SUTURE) IMPLANT
SYR CONTROL 10ML LL (SYRINGE) ×2 IMPLANT
TOWEL OR 17X24 6PK STRL BLUE (TOWEL DISPOSABLE) ×4 IMPLANT
TRAY FOLEY CATH 14FR (SET/KITS/TRAYS/PACK) ×1 IMPLANT
WATER STERILE IRR 1000ML POUR (IV SOLUTION) ×2 IMPLANT

## 2011-02-11 NOTE — Brief Op Note (Signed)
02/11/2011  2:28 PM  PATIENT:  Tracy Harris  30 y.o. female  PRE-OPERATIVE DIAGNOSIS:  Breech Presentation, term gestation  POST-OPERATIVE DIAGNOSIS:  Breech Presentation, term gestation  PROCEDURE:  Procedure(s):Primary low transverse CESAREAN SECTION  SURGEON:  Surgeon(s): Jamira Barfuss Cathie Beams, MD  PHYSICIAN ASSISTANT:   ASSISTANTS: none   ANESTHESIA:   spinal FINDINGS: LIVE FEMALE COMPLETE BREECH POSITION, ANT PLACENTA, NL TUBES AND OVARIES. Apgar 9/9  EBL:  Total I/O In: 2700 [I.V.:2700] Out: 750 [Urine:250; Blood:500]  BLOOD ADMINISTERED:none  DRAINS: none   LOCAL MEDICATIONS USED:  MARCAINE 6CC  SPECIMEN: No Specimen  DISPOSITION OF SPECIMEN:  N/A  COUNTS:  YES  TOURNIQUET:  * No tourniquets in log *  DICTATION: .Other Dictation: Dictation Number   PLAN OF CARE: Admit to inpatient   PATIENT DISPOSITION:  PACU - hemodynamically stable.   Delay start of Pharmacological VTE agent (>24hrs) due to surgical blood loss or risk of bleeding:  no

## 2011-02-11 NOTE — Addendum Note (Signed)
Addendum  created 02/11/11 1738 by Salome Arnt, RN   Modules edited:Anesthesia Events, Notes Section

## 2011-02-11 NOTE — Anesthesia Preprocedure Evaluation (Signed)
Anesthesia Evaluation  Patient identified by MRN, date of birth, ID band Patient awake    Reviewed: Allergy & Precautions, H&P , NPO status , Patient's Chart, lab work & pertinent test results, reviewed documented beta blocker date and time   History of Anesthesia Complications Negative for: history of anesthetic complications  Airway Mallampati: II TM Distance: >3 FB Neck ROM: full    Dental  (+) Teeth Intact   Pulmonary neg pulmonary ROS,  clear to auscultation        Cardiovascular neg cardio ROS regular Normal    Neuro/Psych PSYCHIATRIC DISORDERS (anxiety, depression, anorexia) Negative Neurological ROS     GI/Hepatic Neg liver ROS, GERD- (with pregnancy)  ,  Endo/Other  Negative Endocrine ROS  Renal/GU Kidney stones  Genitourinary negative   Musculoskeletal   Abdominal   Peds  Hematology negative hematology ROS (+)   Anesthesia Other Findings   Reproductive/Obstetrics (+) Pregnancy                           Anesthesia Physical Anesthesia Plan  ASA: II  Anesthesia Plan: Spinal   Post-op Pain Management:    Induction:   Airway Management Planned:   Additional Equipment:   Intra-op Plan:   Post-operative Plan:   Informed Consent: I have reviewed the patients History and Physical, chart, labs and discussed the procedure including the risks, benefits and alternatives for the proposed anesthesia with the patient or authorized representative who has indicated his/her understanding and acceptance.     Plan Discussed with: CRNA and Surgeon  Anesthesia Plan Comments:         Anesthesia Quick Evaluation

## 2011-02-11 NOTE — Anesthesia Postprocedure Evaluation (Signed)
  Anesthesia Post-op Note  Patient: Tracy Harris  Procedure(s) Performed:  CESAREAN SECTION  Patient Location: PACU and Mother/Baby  Anesthesia Type: Spinal  Level of Consciousness: awake, alert  and oriented  Airway and Oxygen Therapy: Patient Spontanous Breathing  Post-op Pain: moderate  Post-op Assessment: Post-op Vital signs reviewed  Post-op Vital Signs: Reviewed and stable  Complications: No apparent anesthesia complications

## 2011-02-11 NOTE — Anesthesia Procedure Notes (Signed)
Spinal  Patient location during procedure: OR Start time: 02/11/2011 1:42 PM Staffing Performed by: anesthesiologist  Preanesthetic Checklist Completed: patient identified, site marked, surgical consent, pre-op evaluation, timeout performed, IV checked, risks and benefits discussed and monitors and equipment checked Spinal Block Patient position: sitting Prep: site prepped and draped and DuraPrep Patient monitoring: heart rate, cardiac monitor, continuous pulse ox and blood pressure Approach: midline Location: L3-4 Injection technique: single-shot Needle Needle type: Sprotte  Needle gauge: 24 G Needle length: 9 cm Assessment Sensory level: T4 Additional Notes Clear free flow CSF on first attempt.  Patient tolerated procedure well.  Jasmine December, MD

## 2011-02-11 NOTE — Consult Note (Signed)
Neonatology Note:   Attendance at C-section:    I was asked to attend this primary C/S at term due to breech presentation. The mother is a G1P0 O pos, GBS neg with bipolar disorder. ROM at delivery, fluid clear. Infant vigorous with good spontaneous cry and tone. Needed only minimal bulb suctioning. Ap 9/9. Lungs clear to ausc in DR, minimal grunting intermittently, but remains pink. To CN to care of Pediatrician.   Deatra James, MD

## 2011-02-11 NOTE — Anesthesia Postprocedure Evaluation (Signed)
Anesthesia Post Note  Patient: Tracy Harris  Procedure(s) Performed:  CESAREAN SECTION  Anesthesia type: Spinal  Patient location: PACU  Post pain: Pain level controlled  Post assessment: Post-op Vital signs reviewed  Last Vitals:  Filed Vitals:   02/11/11 1530  BP: 119/86  Pulse: 81  Temp: 36.4 C  Resp: 16    Post vital signs: Reviewed  Level of consciousness: awake  Complications: No apparent anesthesia complications

## 2011-02-11 NOTE — Transfer of Care (Signed)
Immediate Anesthesia Transfer of Care Note  Patient: Tracy Harris  Procedure(s) Performed:  CESAREAN SECTION  Patient Location: PACU  Anesthesia Type: Spinal  Level of Consciousness: awake, alert  and oriented  Airway & Oxygen Therapy: Patient Spontanous Breathing  Post-op Assessment: Report given to PACU RN  Post vital signs: Reviewed and stable  Complications: No apparent anesthesia complications

## 2011-02-11 NOTE — H&P (Signed)
  See scanned paper chart

## 2011-02-12 ENCOUNTER — Encounter (HOSPITAL_COMMUNITY): Payer: Self-pay

## 2011-02-12 LAB — CBC
Hemoglobin: 10.4 g/dL — ABNORMAL LOW (ref 12.0–15.0)
MCH: 31.7 pg (ref 26.0–34.0)
MCHC: 34 g/dL (ref 30.0–36.0)
Platelets: 152 10*3/uL (ref 150–400)

## 2011-02-12 MED ORDER — LACTULOSE 10 GM/15ML PO SOLN
10.0000 g | Freq: Every day | ORAL | Status: DC
  Administered 2011-02-13 – 2011-02-14 (×2): 10 g via ORAL
  Filled 2011-02-12 (×3): qty 15

## 2011-02-12 MED ORDER — QUETIAPINE FUMARATE ER 300 MG PO TB24
600.0000 mg | ORAL_TABLET | Freq: Every day | ORAL | Status: DC
  Administered 2011-02-12 – 2011-02-13 (×3): 600 mg via ORAL
  Filled 2011-02-12 (×3): qty 2

## 2011-02-12 MED ORDER — BUPROPION HCL 100 MG PO TABS
300.0000 mg | ORAL_TABLET | Freq: Every day | ORAL | Status: DC
  Administered 2011-02-12 – 2011-02-13 (×3): 300 mg via ORAL
  Filled 2011-02-12 (×3): qty 3

## 2011-02-12 MED ORDER — BUPROPION HCL 100 MG PO TABS
200.0000 mg | ORAL_TABLET | Freq: Every day | ORAL | Status: DC
  Administered 2011-02-12 – 2011-02-14 (×3): 200 mg via ORAL
  Filled 2011-02-12 (×3): qty 2

## 2011-02-12 MED ORDER — CEFAZOLIN SODIUM 1-5 GM-% IV SOLN
1.0000 g | Freq: Three times a day (TID) | INTRAVENOUS | Status: AC
  Administered 2011-02-12 (×2): 1 g via INTRAVENOUS
  Filled 2011-02-12 (×2): qty 50

## 2011-02-12 MED ORDER — VITAMIN B-6 50 MG PO TABS
50.0000 mg | ORAL_TABLET | Freq: Two times a day (BID) | ORAL | Status: DC
  Administered 2011-02-12 – 2011-02-14 (×5): 50 mg via ORAL
  Filled 2011-02-12 (×5): qty 1

## 2011-02-12 NOTE — Op Note (Signed)
Tracy Harris, HARVIE           ACCOUNT NO.:  [id number]  MEDICAL RECORD NO.:  [id number]  LOCATION:  9103                          FACILITY:  [facility]  PHYSICIAN:  Maxie Better, M.D.DATE OF BIRTH:  February 17, 1981  DATE OF PROCEDURE:  02/11/2011 DATE OF DISCHARGE:                              OPERATIVE REPORT   PREOPERATIVE DIAGNOSES: 1. Breech presentation. 2. Term gestation.  PROCEDURES: 1. Primary cesarean section, Kerr hysterotomy.  POSTOPERATIVE DIAGNOSES: 1. Complete breech presentation. 2. Term gestation.  ANESTHESIA:  Spinal.  SURGEON:  Maxie Better, MD  ASSISTANT:  None.  PROCEDURE IN DETAIL:  Under adequate spinal anesthesia, the patient was placed in the supine position with a left lateral tilt.  She was sterilely prepped and draped in usual fashion.  Indwelling Foley catheter was sterilely placed.  0.25% Marcaine was injected along the planned Pfannenstiel skin incision site.  Pfannenstiel skin incision was then made and carried down to the rectus fascia.  Rectus fascia was opened transversely.  The rectus fascia was then bluntly and sharply dissected off the rectus muscle in superior and inferior fashion.  The rectus muscles split in midline.  The parietal peritoneum was entered bluntly and extended.  The vesicouterine peritoneum was opened transversely.  The bladder was bluntly dissected off the lower uterine segment and displaced inferiorly with a bladder retractor.  A curvilinear low transverse uterine incision was then made and extended with bandage scissors.  In doing so, the artificial rupture of membranes occurred.  Light meconium fluid was noted.  Subsequent delivery of a live female from a complete breech position was accomplished using the usual breech maneuvers.  The baby was bulb suctioned in the abdomen. The cord was clamped, cut.  The baby was transferred to the awaiting pediatrician, who assigned Apgars of 9 and 9 at 1 and 5 minutes.   The placenta which was anterior was spontaneous and removed intact.  Uterine cavity was cleaned of debris.  No intracavitary defects was noted internally or externally.  The uterine incision had no extension.  It was closed in 2 layers.  The first layer with 0 Monocryl in running locked stitch, second layer was imbricating in 0 Monocryl suture. Normal tubes and ovaries were noted bilaterally.  The abdomen was copiously irrigated.  The paracolic gutters were cleaned of debris with good hemostasis noted along the incision line.  The parietal peritoneum was then closed with 2-0 Vicryl.  The rectus fascia was closed with 0 Vicryl x2.  The subcutaneous area irrigated, small bleeders cauterized. Interrupted 2-0 plain sutures placed and the skin approximated with Ethicon staples.  SPECIMENS:  Placenta, not sent to pathology.  ESTIMATED BLOOD LOSS:  500 mL.  INTRAOPERATIVE FLUID:  2500 mL.  URINE OUTPUT:  250 mL clear yellow urine.  Sponge and instrument counts x2 was correct.  Complication was none. Weight of the baby was 7 pounds and 8 ounces.  The baby was transferred to regular nursery, and the patient was transferred to the recovery room in stable condition.     Maxie Better, M.D.     Fitzgerald/MEDQ  D:  02/11/2011  T:  02/12/2011  Job:  [id number]

## 2011-02-12 NOTE — Progress Notes (Signed)
Provider:  Rubella equivocal. Do you want to order MMR?

## 2011-02-12 NOTE — Progress Notes (Deleted)

## 2011-02-12 NOTE — Progress Notes (Signed)
Subjective: POD# 1 Information for the patient's newborn:  Oralee, Rapaport [[id number]]  female  / circ planned  Reports feeling tired and sleepy, rcvd Ambien after MN. Feeding: bottle Patient reports tolerating PO.  Breast symptoms: none Pain controlled with PCA dilaudid Denies HA/SOB/C/P/N/V/dizziness. Flatus present. She reports vaginal bleeding as normal, without clots.  Ambulated X 1, no void yet, foley cath recently removed.  Objective:   VS:  Filed Vitals:   02/11/11 2230 02/12/11 0030 02/12/11 0211 02/12/11 0653  BP: 126/78 110/72 119/75 107/71  Pulse: 76 88 87 81  Temp: 98.2 F (36.8 C) 98.3 F (36.8 C) 98.6 F (37 C) 98.2 F (36.8 C)  TempSrc: Oral Oral Oral Oral  Resp: 18 18 16 16   Weight:      SpO2: 100% 96% 96% 98%     Intake/Output Summary (Last 24 hours) at 02/12/11 4098 Last data filed at 02/12/11 0800  Gross per 24 hour  Intake   4080 ml  Output   4750 ml  Net   -670 ml        Basename 02/12/11 0512  WBC 9.9  HGB 10.4*  HCT 30.6*  PLT 152     Blood type: --/--/O POS (12/19 1839)  Rubella: Immune (05/22 0000)     Physical Exam:  General: cooperative, no distress and very sleepy, difficulty staying awake during exam CV: Regular rate and rhythm Resp: clear Abdomen: soft, nontender, normal bowel sounds Incision: clean, dry, intact and dressing to LST Uterine Fundus: firm, below umbilicus, nontender Lochia: minimal Ext: extremities normal, atraumatic, no cyanosis or edema and Homans sign is negative, no sign of DVT      Assessment/Plan: 30 y.o.  status post Cesarean section. POD# 1.  s/p Cesarean Delivery.  Indications: breech                Principal Problem:  *PP care - C/S 12/19  Stable post-op  Routine post-op care Hx Anxiety and depression   - stable on meds  Increased sedation on PCA  -d/c PCA, switch to oral narcotics and continue ibuprophen regular diet  Ambulate   PAUL,DANIELA 02/12/2011, 8:38 AM
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
## 2011-02-13 NOTE — Progress Notes (Signed)
Patient ID: AHANA NAJERA, female   DOB: Dec 16, 1980, 30 y.o.   MRN: [id number]   POD # 2  Subjective: Pt reports feeling better than yesterday; more alert.  Mod pain control/ Pain controlled with prescription NSAID's including motrin and narcotic analgesics including percocet Tolerating po/Voiding without problems/ No n/v/Flatus pos Activity: walk in hall Bleeding is light Newborn info:  Information for the patient's newborn:  Jeriyah, Granlund [[id number]]  female  / circ complete/ Feeding: bottle   Objective: VS: Blood pressure 124/84, pulse 96, temperature 98 F (36.7 C), temperature source Oral, resp. rate 20   Physical Exam:  General: alert, cooperative and no distress CV: Regular rate and rhythm Resp: clear Abdomen: soft, nontender, normal bowel sounds Uterine Fundus: firm, below umbilicus, nontender Incision:  Dsg covering incision is intact with old scant old dry drainage noted Perineum: not inspected Lochia: minimal Ext: Homans sign is negative, no sign of DVT and no edema, redness or tenderness in the calves or thighs    A/P: POD # 2/ G1P1001 Doing well Continue routine post op orders Anticipate discharge home in the am

## 2011-02-14 MED ORDER — IBUPROFEN 600 MG PO TABS: 600.0000 mg | ORAL_TABLET | Freq: Four times a day (QID) | ORAL | Status: AC | PRN

## 2011-02-14 MED ORDER — MEASLES, MUMPS & RUBELLA VAC ~~LOC~~ INJ
0.5000 mL | INJECTION | Freq: Once | SUBCUTANEOUS | Status: AC
  Administered 2011-02-14: 0.5 mL via SUBCUTANEOUS
  Filled 2011-02-14: qty 0.5

## 2011-02-14 MED ORDER — OXYCODONE-ACETAMINOPHEN 5-325 MG PO TABS: 1.0000 | ORAL_TABLET | ORAL | Status: AC | PRN

## 2011-02-14 NOTE — Progress Notes (Signed)
Subjective: POD# 3 Information for the patient's newborn:  Nevayah, Faust [[id number]]  female  / circ done  Reports feeling well, improved over last couple of days. Feeding: bottle Patient reports tolerating PO.  Breast symptoms: none Pain controlled with percocet and motrin Denies HA/SOB/C/P/N/V/dizziness. Flatus present, (+) BM. She reports vaginal bleeding as normal, without clots.  She is ambulating, urinating without difficult.     Objective:   VS:  Filed Vitals:   02/13/11 0506 02/13/11 1340 02/13/11 2126 02/14/11 0644  BP: 124/84 131/80 112/71 118/81  Pulse: 96 92 96 94  Temp: 98 F (36.7 C) 98.7 F (37.1 C) 99.4 F (37.4 C) 98.3 F (36.8 C)  TempSrc: Oral Oral Oral Oral  Resp: 20 20 18 18   Weight:      SpO2:        No intake or output data in the 24 hours ending 02/14/11 0856      Basename 02/12/11 0512  WBC 9.9  HGB 10.4*  HCT 30.6*  PLT 152     Blood type: --/--/O POS (12/19 1839)  Rubella: Immune (05/22 0000)     Physical Exam:  General: alert, cooperative and no distress CV: Regular rate and rhythm Resp: clear Abdomen: soft, nontender, normal bowel sounds Incision: clean, dry, intact and staples intact Uterine Fundus: firm, below umbilicus, appropriately tender Lochia: minimal Ext: Homans sign is negative, no sign of DVT and no edema, redness or tenderness in the calves or thighs      Assessment/Plan: 30 y.o.  status post Cesarean section. POD# 3.  s/p Cesarean Delivery.  Indications: breech                Principal Problem:  *PP care - C/S 12/19  Doing well, stable.    Depression and anxiety - stable on meds, continue meds PP        D/C home     PAUL,DANIELA 02/14/2011, 8:56 AM

## 2011-02-14 NOTE — Discharge Summary (Signed)
POSTOPERATIVE DISCHARGE SUMMARY:  Patient ID: Tracy Harris MRN: [id number] DOB/AGE: [birth year] 30 y.o.  Admit date: 02/11/2011 Discharge date:  02/14/2011   Admission Diagnoses:  Intrauterine pregnancy at term Breech presentation Bipolar disorder - stable  Discharge Diagnoses:   Term Pregnancy-delivered Post-op day 3, s/p cesarean section  Prenatal history: G1P1001   EDC : 02/17/2011, by Other Basis  Prenatal care at [facility] since [redacted] weeks gestation  Prenatal course complicated by bipolar disorder and breech presentation  Prenatal Labs: ABO, Rh: O (05/22 0000)  Antibody: Negative (05/22 0000) Rubella: Equivocal (05/22 0000)  RPR: NON REACTIVE (12/14 1031)  HBsAg: Negative (05/22 0000)  HIV: Non-reactive (05/22 0000)  GBS:   negative 1 hr Glucola : 123   Medical / Surgical History :  Past medical history:  Past Medical History  Diagnosis Date  . Hypoglycemia, unspecified     During pregnancy  . Endometriosis     Patient reported  . Asthma     last inhaler use 3 yrs.  . Kidney stones   . Anorexia nervosa 1994; 2003    Hospitalized several times; 30 yo, 30 yo  . GERD (gastroesophageal reflux disease)     assoc. with pregnancy  . Anxiety   . Depression   . PP care - C/S 12/19 02/12/2011    Past surgical history:  Past Surgical History  Procedure Date  . Other surgical history 2012    Removed endometriosis scarring  . Tonsillectomy 2011  . Kidney stone surgery 2002  . Diagnostic laparoscopy   . Cesarean section 02/11/2011    Procedure: CESAREAN SECTION;  Surgeon: Serita Kyle, MD;  Location: [facility];  Service: Gynecology;  Laterality: N/A;    Family History: No family history on file.  Social History:  reports that she has never smoked. She does not have any smokeless tobacco history on file. She reports that she does not drink alcohol or use illicit drugs.   Allergies: Ciprofloxacin and Other    Current  Medications at time of admission:  Wellbutrin, Seroquel, Zofran, Phenergan, Ambien, PNV     Intrapartum Course:   Procedures: Cesarean section delivery of female newborn by Dr Cherly Hensen  See operative report for further details  Postoperative / postpartum course: uneventful. Infant underwent circumcision during hospital stay.  Physical Exam:  VSS: Blood pressure 118/81, pulse 94, temperature 98.3 F (36.8 C), temperature source Oral, resp. rate 18, weight 79.379 kg (175 lb), last menstrual period 05/20/2010, SpO2 99.00%, unknown if currently breastfeeding.   LABS:  Lab Results  Component Value Date   WBC 9.9 02/12/2011   HGB 10.4* 02/12/2011   HCT 30.6* 02/12/2011   MCV 93.3 02/12/2011   PLT 152 02/12/2011     Incision:  approximated with staples / no erythema / no ecchymosis / no drainage Staples: removed prior to discharge and replaced by steri strips.  Discharge Instructions:  Discharged Condition: good Activity: pelvic rest Diet: routine Medications:  Current Discharge Medication List    START taking these medications   Details  ibuprofen (ADVIL,MOTRIN) 600 MG tablet Take 1 tablet (600 mg total) by mouth every 6 (six) hours as needed for pain. Qty: 60 tablet, Refills: 0    oxyCODONE-acetaminophen (PERCOCET) 5-325 MG per tablet Take 1-2 tablets by mouth every 3 (three) hours as needed (moderate - severe pain). Qty: 30 tablet, Refills: 0      CONTINUE these medications which have NOT CHANGED   Details  !! buPROPion (WELLBUTRIN) 100 MG  tablet Take 200 mg by mouth daily after breakfast.      !! buPROPion (WELLBUTRIN) 100 MG tablet Take 300 mg by mouth at bedtime.      QUEtiapine (SEROQUEL XR) 300 MG 24 hr tablet Take 600 mg by mouth at bedtime.      lactulose (KRISTALOSE) 10 G packet Take 10 g by mouth as needed. For constipation     Prenat w/o A-FeCbGl-DSS-FA-DHA (CITRANATAL ASSURE PO) Take 1 tablet by mouth daily.      pyridOXINE (VITAMIN B-6) 50 MG tablet  Take 50 mg by mouth 2 (two) times daily.      zolpidem (AMBIEN) 10 MG tablet Take 10 mg by mouth at bedtime.       !! - Potential duplicate medications found. Please discuss with provider.    STOP taking these medications     ondansetron (ZOFRAN-ODT) 8 MG disintegrating tablet      promethazine (PHENERGAN) 25 MG tablet        Condition: stable Postpartum Instructions: refer to practice specific booklet Discharge to: home Disposition: Discharge to home Follow up :  Follow-up Information    Follow up with COUSINS,SHERONETTE A, MD in 6 weeks.   Contact information:   [address] [place] [address] [phone]           Signed: Arlan Organ 02/14/2011, 9:03 AM

## 2012-06-13 DIAGNOSIS — J4540 Moderate persistent asthma, uncomplicated: Secondary | ICD-10-CM | POA: Insufficient documentation

## 2013-03-06 DIAGNOSIS — J4550 Severe persistent asthma, uncomplicated: Secondary | ICD-10-CM | POA: Insufficient documentation

## 2013-09-15 ENCOUNTER — Encounter (HOSPITAL_BASED_OUTPATIENT_CLINIC_OR_DEPARTMENT_OTHER): Payer: Self-pay | Admitting: *Deleted

## 2013-09-15 NOTE — Progress Notes (Addendum)
NPO AFTER MN WITH EXCEPTION CLEAR LIQUIDS UNTIL 0700 (NO CREAM/ MILK PRODUCTS).  ARRIVE AT 1130. NEEDS HG AND URINE PREG. WILL TAKE AM MEDS W/ SIPS OF WATER DOS.  PRE-OP ORDERS PENDING.

## 2013-09-20 ENCOUNTER — Encounter (HOSPITAL_BASED_OUTPATIENT_CLINIC_OR_DEPARTMENT_OTHER): Payer: BC Managed Care – PPO | Admitting: Anesthesiology

## 2013-09-20 ENCOUNTER — Encounter (HOSPITAL_BASED_OUTPATIENT_CLINIC_OR_DEPARTMENT_OTHER): Payer: Self-pay | Admitting: *Deleted

## 2013-09-20 ENCOUNTER — Ambulatory Visit (HOSPITAL_BASED_OUTPATIENT_CLINIC_OR_DEPARTMENT_OTHER)
Admission: RE | Admit: 2013-09-20 | Discharge: 2013-09-20 | Disposition: A | Discharge status: Home / Self Care | Payer: BC Managed Care – PPO | Source: Ambulatory Visit | Attending: Obstetrics and Gynecology | Admitting: Obstetrics and Gynecology

## 2013-09-20 ENCOUNTER — Encounter (HOSPITAL_BASED_OUTPATIENT_CLINIC_OR_DEPARTMENT_OTHER)
Admission: RE | Disposition: A | Discharge status: Home / Self Care | Payer: Self-pay | Source: Ambulatory Visit | Attending: Obstetrics and Gynecology

## 2013-09-20 ENCOUNTER — Ambulatory Visit (HOSPITAL_BASED_OUTPATIENT_CLINIC_OR_DEPARTMENT_OTHER): Payer: BC Managed Care – PPO | Admitting: Anesthesiology

## 2013-09-20 DIAGNOSIS — F411 Generalized anxiety disorder: Secondary | ICD-10-CM | POA: Insufficient documentation

## 2013-09-20 DIAGNOSIS — N83209 Unspecified ovarian cyst, unspecified side: Secondary | ICD-10-CM | POA: Insufficient documentation

## 2013-09-20 DIAGNOSIS — F319 Bipolar disorder, unspecified: Secondary | ICD-10-CM | POA: Insufficient documentation

## 2013-09-20 DIAGNOSIS — Z87891 Personal history of nicotine dependence: Secondary | ICD-10-CM | POA: Insufficient documentation

## 2013-09-20 DIAGNOSIS — N803 Endometriosis of pelvic peritoneum, unspecified: Secondary | ICD-10-CM | POA: Insufficient documentation

## 2013-09-20 DIAGNOSIS — Z881 Allergy status to other antibiotic agents status: Secondary | ICD-10-CM | POA: Insufficient documentation

## 2013-09-20 DIAGNOSIS — J45909 Unspecified asthma, uncomplicated: Secondary | ICD-10-CM | POA: Insufficient documentation

## 2013-09-20 DIAGNOSIS — K219 Gastro-esophageal reflux disease without esophagitis: Secondary | ICD-10-CM | POA: Insufficient documentation

## 2013-09-20 HISTORY — DX: Personal history of other mental and behavioral disorders: Z86.59

## 2013-09-20 HISTORY — PX: LAPAROSCOPY: SHX197

## 2013-09-20 HISTORY — DX: Personal history of urinary calculi: Z87.442

## 2013-09-20 HISTORY — DX: Unspecified asthma, uncomplicated: J45.909

## 2013-09-20 HISTORY — DX: Presence of spectacles and contact lenses: Z97.3

## 2013-09-20 HISTORY — DX: Personal history of other medical treatment: Z92.89

## 2013-09-20 HISTORY — DX: Nausea with vomiting, unspecified: R11.2

## 2013-09-20 HISTORY — DX: Other specified postprocedural states: Z98.890

## 2013-09-20 HISTORY — DX: Bipolar disorder, unspecified: F31.9

## 2013-09-20 LAB — POCT HEMOGLOBIN-HEMACUE: HEMOGLOBIN: 13.2 g/dL (ref 12.0–15.0)

## 2013-09-20 SURGERY — LAPAROSCOPY OPERATIVE
Anesthesia: General | Site: Pelvis

## 2013-09-20 MED ORDER — METHYLENE BLUE 1 % INJ SOLN
INTRAMUSCULAR | Status: DC | PRN
  Administered 2013-09-20: 10 mL

## 2013-09-20 MED ORDER — GLYCOPYRROLATE 0.2 MG/ML IJ SOLN
INTRAMUSCULAR | Status: DC | PRN
  Administered 2013-09-20: 0.4 mg via INTRAVENOUS

## 2013-09-20 MED ORDER — LIDOCAINE HCL (CARDIAC) 20 MG/ML IV SOLN
INTRAVENOUS | Status: DC | PRN
  Administered 2013-09-20: 80 mg via INTRAVENOUS

## 2013-09-20 MED ORDER — FENTANYL CITRATE 0.05 MG/ML IJ SOLN
INTRAMUSCULAR | Status: AC
  Filled 2013-09-20: qty 4

## 2013-09-20 MED ORDER — OXYCODONE-ACETAMINOPHEN 7.5-325 MG PO TABS: 1.0000 | ORAL_TABLET | ORAL | Status: DC | PRN

## 2013-09-20 MED ORDER — PROMETHAZINE HCL 25 MG/ML IJ SOLN
6.2500 mg | INTRAMUSCULAR | Status: DC | PRN
  Filled 2013-09-20: qty 1

## 2013-09-20 MED ORDER — SUCCINYLCHOLINE CHLORIDE 20 MG/ML IJ SOLN
INTRAMUSCULAR | Status: DC | PRN
  Administered 2013-09-20: 100 mg via INTRAVENOUS

## 2013-09-20 MED ORDER — PROPOFOL INFUSION 10 MG/ML OPTIME
INTRAVENOUS | Status: DC | PRN
  Administered 2013-09-20: 140 ug/kg/min via INTRAVENOUS

## 2013-09-20 MED ORDER — CEFAZOLIN SODIUM-DEXTROSE 2-3 GM-% IV SOLR
2.0000 g | INTRAVENOUS | Status: AC
  Administered 2013-09-20: 2 g via INTRAVENOUS
  Filled 2013-09-20: qty 50

## 2013-09-20 MED ORDER — LACTATED RINGERS IR SOLN
Status: DC | PRN
  Administered 2013-09-20: 3000 mL

## 2013-09-20 MED ORDER — PROPOFOL 10 MG/ML IV BOLUS
INTRAVENOUS | Status: DC | PRN
  Administered 2013-09-20: 200 mg via INTRAVENOUS
  Administered 2013-09-20: 100 mg via INTRAVENOUS

## 2013-09-20 MED ORDER — OXYCODONE HCL 5 MG/5ML PO SOLN
5.0000 mg | Freq: Once | ORAL | Status: AC | PRN
  Filled 2013-09-20: qty 5

## 2013-09-20 MED ORDER — DEXAMETHASONE SODIUM PHOSPHATE 4 MG/ML IJ SOLN
INTRAMUSCULAR | Status: DC | PRN
  Administered 2013-09-20: 10 mg via INTRAVENOUS

## 2013-09-20 MED ORDER — ONDANSETRON HCL 4 MG PO TABS: 4.0000 mg | ORAL_TABLET | Freq: Three times a day (TID) | ORAL | Status: DC | PRN

## 2013-09-20 MED ORDER — MIDAZOLAM HCL 2 MG/2ML IJ SOLN
INTRAMUSCULAR | Status: AC
  Filled 2013-09-20: qty 2

## 2013-09-20 MED ORDER — BUPIVACAINE-EPINEPHRINE 0.25% -1:200000 IJ SOLN
INTRAMUSCULAR | Status: DC | PRN
  Administered 2013-09-20: 10 mL

## 2013-09-20 MED ORDER — HYDROMORPHONE HCL PF 1 MG/ML IJ SOLN
INTRAMUSCULAR | Status: AC
  Filled 2013-09-20: qty 1

## 2013-09-20 MED ORDER — OXYCODONE HCL 5 MG PO TABS
ORAL_TABLET | ORAL | Status: AC
  Filled 2013-09-20: qty 1

## 2013-09-20 MED ORDER — FENTANYL CITRATE 0.05 MG/ML IJ SOLN
INTRAMUSCULAR | Status: DC | PRN
  Administered 2013-09-20 (×4): 50 ug via INTRAVENOUS

## 2013-09-20 MED ORDER — CEFAZOLIN SODIUM-DEXTROSE 2-3 GM-% IV SOLR
INTRAVENOUS | Status: AC
  Filled 2013-09-20: qty 50

## 2013-09-20 MED ORDER — MEPERIDINE HCL 25 MG/ML IJ SOLN
6.2500 mg | INTRAMUSCULAR | Status: DC | PRN
  Filled 2013-09-20: qty 1

## 2013-09-20 MED ORDER — ONDANSETRON HCL 4 MG/2ML IJ SOLN
INTRAMUSCULAR | Status: DC | PRN
  Administered 2013-09-20: 4 mg via INTRAVENOUS

## 2013-09-20 MED ORDER — ACETAMINOPHEN 10 MG/ML IV SOLN
INTRAVENOUS | Status: DC | PRN
  Administered 2013-09-20: 1000 mg via INTRAVENOUS

## 2013-09-20 MED ORDER — HYDROMORPHONE HCL PF 1 MG/ML IJ SOLN
0.2500 mg | INTRAMUSCULAR | Status: DC | PRN
  Administered 2013-09-20 (×2): 0.5 mg via INTRAVENOUS
  Administered 2013-09-20: 0.25 mg via INTRAVENOUS
  Filled 2013-09-20: qty 1

## 2013-09-20 MED ORDER — NEOSTIGMINE METHYLSULFATE 10 MG/10ML IV SOLN
INTRAVENOUS | Status: DC | PRN
  Administered 2013-09-20: 3 mg via INTRAVENOUS

## 2013-09-20 MED ORDER — OXYCODONE HCL 5 MG PO TABS
5.0000 mg | ORAL_TABLET | Freq: Once | ORAL | Status: AC | PRN
  Administered 2013-09-20: 5 mg via ORAL
  Filled 2013-09-20: qty 1

## 2013-09-20 MED ORDER — SCOPOLAMINE 1 MG/3DAYS TD PT72
1.0000 | MEDICATED_PATCH | TRANSDERMAL | Status: DC
  Administered 2013-09-20: 1 via TRANSDERMAL
  Administered 2013-09-20: 1.5 mg via TRANSDERMAL
  Filled 2013-09-20: qty 1

## 2013-09-20 MED ORDER — MIDAZOLAM HCL 5 MG/5ML IJ SOLN
INTRAMUSCULAR | Status: DC | PRN
  Administered 2013-09-20: 2 mg via INTRAVENOUS

## 2013-09-20 MED ORDER — ROCURONIUM BROMIDE 100 MG/10ML IV SOLN
INTRAVENOUS | Status: DC | PRN
  Administered 2013-09-20: 30 mg via INTRAVENOUS

## 2013-09-20 MED ORDER — LACTATED RINGERS IV SOLN
INTRAVENOUS | Status: DC
  Administered 2013-09-20 (×2): via INTRAVENOUS
  Filled 2013-09-20: qty 1000

## 2013-09-20 SURGICAL SUPPLY — 76 items
ADH SKN CLS APL DERMABOND .7 (GAUZE/BANDAGES/DRESSINGS)
BAG DECANTER FOR FLEXI CONT (MISCELLANEOUS) ×2 IMPLANT
BAG SPEC RTRVL LRG 6X4 10 (ENDOMECHANICALS)
BAG URINE DRAINAGE (UROLOGICAL SUPPLIES) ×3 IMPLANT
BLADE SURG 15 STRL LF DISP TIS (BLADE) ×1 IMPLANT
BLADE SURG 15 STRL SS (BLADE) ×3
BNDG ADH 7/8 SPOT LF (GAUZE/BANDAGES/DRESSINGS) ×2 IMPLANT
BRR ADH 6X5 SEPRAFILM 1 SHT (MISCELLANEOUS)
CATH FOLEY 2WAY SLVR  5CC 14FR (CATHETERS)
CATH FOLEY 2WAY SLVR 5CC 14FR (CATHETERS) IMPLANT
CATH ROBINSON RED A/P 16FR (CATHETERS) IMPLANT
CLOSURE WOUND 1/2 X4 (GAUZE/BANDAGES/DRESSINGS) ×1
CLOTH BEACON ORANGE TIMEOUT ST (SAFETY) ×1 IMPLANT
COVER MAYO STAND STRL (DRAPES) ×3 IMPLANT
DERMABOND ADVANCED (GAUZE/BANDAGES/DRESSINGS)
DERMABOND ADVANCED .7 DNX12 (GAUZE/BANDAGES/DRESSINGS) IMPLANT
DEVICE TROCAR PUNCTURE CLOSURE (ENDOMECHANICALS) IMPLANT
DRAPE CAMERA CLOSED 9X96 (DRAPES) ×3 IMPLANT
DRAPE UNDERBUTTOCKS STRL (DRAPE) ×3 IMPLANT
ELECT NDL TIP 2.8 STRL (NEEDLE) IMPLANT
ELECT NEEDLE TIP 2.8 STRL (NEEDLE) IMPLANT
ELECT REM PT RETURN 9FT ADLT (ELECTROSURGICAL) ×3
ELECTRODE REM PT RTRN 9FT ADLT (ELECTROSURGICAL) ×1 IMPLANT
EVACUATOR SMOKE 8.L (FILTER) IMPLANT
FORCEPS CUTTING 33CM 5MM (CUTTING FORCEPS) IMPLANT
GLOVE BIO SURGEON STRL SZ8 (GLOVE) ×3 IMPLANT
GLOVE BIOGEL PI IND STRL 7.5 (GLOVE) IMPLANT
GLOVE BIOGEL PI IND STRL 8.5 (GLOVE) ×1 IMPLANT
GLOVE BIOGEL PI INDICATOR 7.5 (GLOVE) ×2
GLOVE BIOGEL PI INDICATOR 8.5 (GLOVE) ×2
GLOVE INDICATOR 7.0 STRL GRN (GLOVE) ×2 IMPLANT
GLOVE SURG SS PI 7.5 STRL IVOR (GLOVE) ×2 IMPLANT
GOWN PREVENTION PLUS LG XLONG (DISPOSABLE) ×2 IMPLANT
GOWN STRL REUS W/TWL XL LVL3 (GOWN DISPOSABLE) ×6 IMPLANT
HOLDER FOLEY CATH W/STRAP (MISCELLANEOUS) ×3 IMPLANT
IV LACTATED RINGER IRRG 3000ML (IV SOLUTION) ×3
IV LR IRRIG 3000ML ARTHROMATIC (IV SOLUTION) IMPLANT
IV NS 250ML (IV SOLUTION) ×3
IV NS 250ML BAXH (IV SOLUTION) IMPLANT
MANIPULATOR UTERINE 4.5 ZUMI (MISCELLANEOUS) ×3 IMPLANT
NDL HYPO 25X1 1.5 SAFETY (NEEDLE) ×1 IMPLANT
NDL INSUFFLATION 14GA 120MM (NEEDLE) ×1 IMPLANT
NEEDLE HYPO 25X1 1.5 SAFETY (NEEDLE) ×3 IMPLANT
NEEDLE INSUFFLATION 14GA 120MM (NEEDLE) ×3 IMPLANT
NS IRRIG 500ML POUR BTL (IV SOLUTION) ×3 IMPLANT
PACK BASIN DAY SURGERY FS (CUSTOM PROCEDURE TRAY) ×3 IMPLANT
PACK LAPAROSCOPY II (CUSTOM PROCEDURE TRAY) ×3 IMPLANT
PAD OB MATERNITY 4.3X12.25 (PERSONAL CARE ITEMS) ×3 IMPLANT
PENCIL BUTTON HOLSTER BLD 10FT (ELECTRODE) IMPLANT
POUCH SPECIMEN RETRIEVAL 10MM (ENDOMECHANICALS) IMPLANT
SCALPEL HARMONIC ACE (MISCELLANEOUS) ×2 IMPLANT
SEPRAFILM MEMBRANE 5X6 (MISCELLANEOUS) IMPLANT
SET IRRIG TUBING LAPAROSCOPIC (IRRIGATION / IRRIGATOR) ×2 IMPLANT
SOLUTION ANTI FOG 6CC (MISCELLANEOUS) ×4 IMPLANT
STRIP CLOSURE SKIN 1/2X4 (GAUZE/BANDAGES/DRESSINGS) ×1 IMPLANT
SUT MNCRL AB 4-0 PS2 18 (SUTURE) ×3 IMPLANT
SUT PROLENE 0 CT 1 30 (SUTURE) IMPLANT
SUT VIC AB 2-0 CT1 27 (SUTURE)
SUT VIC AB 2-0 CT1 TAPERPNT 27 (SUTURE) IMPLANT
SUT VIC AB 2-0 UR6 27 (SUTURE) IMPLANT
SUT VICRYL 0 TIES 12 18 (SUTURE) IMPLANT
SYR 20CC LL (SYRINGE) IMPLANT
SYR 3ML 23GX1 SAFETY (SYRINGE) IMPLANT
SYR 50ML LL SCALE MARK (SYRINGE) IMPLANT
SYR 5ML LL (SYRINGE) IMPLANT
SYR CONTROL 10ML LL (SYRINGE) ×3 IMPLANT
SYRINGE 12CC LL (MISCELLANEOUS) ×2 IMPLANT
SYS LAPSCP GELPORT 120MM (MISCELLANEOUS)
SYSTEM LAPSCP GELPORT 120MM (MISCELLANEOUS) IMPLANT
TOWEL OR 17X24 6PK STRL BLUE (TOWEL DISPOSABLE) ×6 IMPLANT
TRAY DSU PREP LF (CUSTOM PROCEDURE TRAY) ×3 IMPLANT
TROCAR OPTI TIP 5M 100M (ENDOMECHANICALS) ×7 IMPLANT
TROCAR XCEL DIL TIP R 11M (ENDOMECHANICALS) IMPLANT
TUBING INSUFFLATION 10FT LAP (TUBING) ×3 IMPLANT
WARMER LAPAROSCOPE (MISCELLANEOUS) ×3 IMPLANT
WATER STERILE IRR 500ML POUR (IV SOLUTION) ×3 IMPLANT

## 2013-09-20 NOTE — H&P (Signed)
Tracy Harris is a 33 y.o. female , originally referred to me by Dr. 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 Patient is a gravida 1 para 1001 white female. She was able to conceive after laparoscopic treatment of stage II pelvic endometriosis by me in 2012, even though she developed a chemical peritonitis after he use of Seprafilm slurry intraperitoneally. Patient now describes her pelvic pain as 8/10 and disabling her daily activities. It seemed the lower abdomen and radiating to her thighs and back. She has tried NSAIDs without success. She has amuse contraceptions and has not conceived since the birth of her last child. Patient would like to preserve her childbearing potential.  Pertinent Gynecological History: Menses: flow is excessive with use of 3 pads or tampons on heaviest days Contraception: none DES exposure: denies Blood transfusions: none Sexually transmitted diseases: no past history Previous GYN Procedures: none Last pap: normal  OB History: G:1 P:1   Menstrual History: Menarche age: 19 No LMP recorded.    Past Medical History  Diagnosis Date  . Endometriosis   . Anxiety   . History of kidney stones   . History of positive PPD   . PONV (postoperative nausea and vomiting)   . Mild asthma   . GERD (gastroesophageal reflux disease)   . Wears contact lenses   . Bipolar 1 disorder   . History of anorexia nervosa     hospitalized age 72 and 89                    Past Surgical History  Procedure Laterality Date  . Cesarean section  02/11/2011    Procedure: CESAREAN SECTION;  Surgeon: Serita Kyle, MD;  Location: [facility];  Service: Gynecology;  Laterality: N/A;  . Dx  laparoscopy/  bx left ovarian cyst/  ablation and excision endometriosis/ lysis of adhesions  03-21-2010    and chromopurtubation  . Laser ablation hpv , vagina, cervix, perianal , and vulva  08-15-2002  . Tonsillectomy  2011             History  reviewed. No pertinent family history. No hereditary disease.  No cancer of breast, ovary, uterus. No cutaneous leiomyomatosis or renal cell carcinoma.  History   Social History  . Marital Status: Married    Spouse Name: N/A    Number of Children: N/A  . Years of Education: N/A   Occupational History  . Not on file.   Social History Main Topics  . Smoking status: Former Smoker -- 1.00 packs/day for 10 years    Types: Cigarettes    Quit date: 09/16/2003  . Smokeless tobacco: Never Used  . Alcohol Use: Yes     Comment: rare  . Drug Use: No  . Sexual Activity: Not on file   Other Topics Concern  . Not on file   Social History Narrative  . No narrative on file    Allergies  Allergen Reactions  . Ciprofloxacin Nausea Only  . Other Other (See Comments)    Seprafilm medication used in abdomin  last laparoscopy surgery caused extreme swelling and pain.    No current facility-administered medications on file prior to encounter.   Current Outpatient Prescriptions on File Prior to Encounter  Medication Sig Dispense Refill  . buPROPion (WELLBUTRIN) 100 MG tablet Take 200-300 mg by mouth 2 (two) times daily. 200mg  in am and 300mg  qhs      . QUEtiapine (SEROQUEL XR) 300 MG 24  hr tablet Take 600 mg by mouth at bedtime.          Review of Systems  Constitutional: Negative.   HENT: Negative.   Eyes: Negative.   Respiratory: Negative.   Cardiovascular: Negative.   Gastrointestinal: Negative.   Genitourinary: Negative.   Musculoskeletal: Negative.   Skin: Negative.   Neurological: Negative.   Endo/Heme/Allergies: Negative.   Psychiatric/Behavioral: Negative.      Physical Exam  BP 116/76  Pulse 85  Temp(Src) 97.5 F (36.4 C) (Oral)  Resp 16  Ht $RemoveBefore EID_ElgvMtOZCSnzUCZuZymwQXRhUrxHvmIj$5\' 3"SpO2 100%  LMP 09/04/2013  Breastfeeding? No Constitutional: She is oriented to person, place, and time. She appears well-developed and well-nourished.  HENT:  Head:  Normocephalic and atraumatic.  Nose: Nose normal.  Mouth/Throat: Oropharynx is clear and moist. No oropharyngeal exudate.  Eyes: Conjunctivae normal and EOM are normal. Pupils are equal, round, and reactive to light. No scleral icterus.  Neck: Normal range of motion. Neck supple. No tracheal deviation present. No thyromegaly present.  Cardiovascular: Normal rate.   Respiratory: Effort normal and breath sounds normal.  GI: Soft. Bowel sounds are normal. She exhibits no distension and no mass. There is no tenderness.  Lymphadenopathy:    She has no cervical adenopathy.  Neurological: She is alert and oriented to person, place, and time. She has normal reflexes.  Skin: Skin is warm.  Psychiatric: She has a normal mood and affect. Her behavior is normal. Judgment and thought content normal.    Assessment/Plan:  Chronic pelvic pain and history of stage II endometriosis Secondary infertility Patient is scheduled for laparoscopy, excision of endometriosis and, to patient. Benefits, risks, expected chances of successful suppression of pelvic pain and conception were reviewed with the patient in detail. All of her questions were answered.  Fermin SchwabYALCINKAYA,Vincente Asbridge

## 2013-09-20 NOTE — Discharge Instructions (Signed)
°  Post Anesthesia Home Care Instructions ° °Activity: °Get plenty of rest for the remainder of the day. A responsible adult should stay with you for 24 hours following the procedure.  °For the next 24 hours, DO NOT: °-Drive a car °-Operate machinery °-Drink alcoholic beverages °-Take any medication unless instructed by your physician °-Make any legal decisions or sign important papers. ° °Meals: °Start with liquid foods such as gelatin or soup. Progress to regular foods as tolerated. Avoid greasy, spicy, heavy foods. If nausea and/or vomiting occur, drink only clear liquids until the nausea and/or vomiting subsides. Call your physician if vomiting continues. ° °Special Instructions/Symptoms: °Your throat may feel dry or sore from the anesthesia or the breathing tube placed in your throat during surgery. If this causes discomfort, gargle with warm salt water. The discomfort should disappear within 24 hours. ° °HOME CARE INSTRUCTIONS - LAPAROSCOPY ° °Wound Care: The bandaids or dressing which are placed over the skin openings may be removed the day after surgery. The incision should be kept clean and dry. The stitches do not need to be removed. Should the incision become sore, red, and swollen after the first week, check with your doctor. ° °Personal Hygiene: Shower the day after your procedure. Always wipe from front to back after elimination.  ° °Activity: Do not drive or operate any equipment today. The effects of the anesthesia are still present and drowsiness may result. Rest today, not necessarily flat bed rest, just take it easy. You may resume your normal activity in one to three days or as instructed by your physician. ° °Sexual Activity: You resume sexual activity as indicated by your physician_________. °If your laparoscopy was for a sterilization ( tubes tied ), continue current method of birth control until after your next period or ask for specific instructions from your doctor. ° °Diet: Eat a light diet  as desired this evening. You may resume a regular diet tomorrow. ° °Return to Work: Two to three days or as indicated by your doctor. ° °Expectations °After Surgery: Your surgery will cause vaginal drainage or spotting which may continue for 2-3 days. Mild abdominal discomfort or tenderness is not unusual and some shoulder pain may also be noted which can be relieved by lying flat in pain. ° °Call Your Doctor °If these Occur:  Persistent or heavy bleeding at incision site °      Redness or swelling around incision °      Elevation of temperature greater than 100 degrees F ° °Call for follow-up appointment _____________. °

## 2013-09-20 NOTE — Anesthesia Procedure Notes (Signed)
Procedure Name: Intubation Date/Time: 09/20/2013 2:08 PM Performed by: Tyrone NineSAUVE, Nasreen Goedecke F Pre-anesthesia Checklist: Patient identified, Emergency Drugs available, Suction available, Patient being monitored and Timeout performed Patient Re-evaluated:Patient Re-evaluated prior to inductionOxygen Delivery Method: Circle system utilized Preoxygenation: Pre-oxygenation with 100% oxygen Intubation Type: IV induction Ventilation: Mask ventilation without difficulty Laryngoscope Size: Mac and 3 Grade View: Grade I Tube type: Oral Number of attempts: 2 Airway Equipment and Method: Stylet and Bite block Placement Confirmation: ETT inserted through vocal cords under direct vision and positive ETCO2 Secured at: 20 cm Tube secured with: Tape Dental Injury: Teeth and Oropharynx as per pre-operative assessment

## 2013-09-20 NOTE — Anesthesia Postprocedure Evaluation (Signed)
  Anesthesia Post-op Note  Patient: Roxan HockeyKathleen M Hamza  Procedure(s) Performed: Procedure(s) (LRB): LAPAROSCOPY LYSIS OF ADHESIONS,  EXCISION OF ENDOMETRIOSIS RIGHT OVARIAN CYSTECTOMY. (N/A)  Patient Location: PACU  Anesthesia Type: General  Level of Consciousness: awake and alert   Airway and Oxygen Therapy: Patient Spontanous Breathing  Post-op Pain: mild  Post-op Assessment: Post-op Vital signs reviewed, Patient's Cardiovascular Status Stable, Respiratory Function Stable, Patent Airway and No signs of Nausea or vomiting  Last Vitals:  Filed Vitals:   09/20/13 1615  BP: 104/69  Pulse: 61  Temp:   Resp: 15    Post-op Vital Signs: stable   Complications: No apparent anesthesia complications

## 2013-09-20 NOTE — Op Note (Signed)
Operative Note  Preoperative diagnosis: Chronic pelvic pain, rule out recurrent tendon which  Postoperative diagnosis: Chronic pelvic pain, stage III endometriosis of pelvic peritoneum, both ovaries, endometriosis of rectosigmoid serosa  Anesthesia: Gen. endotracheal  Complications: None  Estimated blood loss: 20 cc Specimens: Right uterosacral ligament biopsy, right ovarian cysts wall to pathology                                                                                                                                                                                                                                                                                                Findings: On laparoscopy, upper abdomen, liver surface and diaphragm surfaces were normal. The appendix was  normal . There were omental adhesions to the entire lower abdominal wall at midline. These were filmy. There were additional omental adhesions to the anterior abdominal wall at the level of the pelvic inlet. These were lysed  The pelvic peritoneum  in the posterior cul-de-sac contained a lot of clear vesicular lesions, abnormal capillaries as well as carbon form her previous CO2 laser ablation of endometriosis. These areas were ablated with needle tip electrode.  The left ovary was 50% adherent with cohesive adhesions to the pelvic sidewall. The rim contained a brown spots, indicating presence of some endometriosis underneath.  Almost half of these adhesions were separated by electrosurgical sharp and blunt dissection. During this process a 1 cm extraovarian endometrioma was encountered extending into the left pelvic sidewall; this was drained. Cohesive nature of all of these adhesions and the proximity to the left ureter kept me from attempting further adhesiolysis. The left tube appeared normal proximally and distally. There was a brown lesion in the mesosalpinx of the left midtube approximately 0.5 cm. This was  ablated. It passed the dilute methylene blue during chromotubation. The right tube appeared normal proximally and distally, with the fimbria at 5 out of 5. This tube was also patent. The right ovary was densely adherent to the pelvic sidewall over an area of approximately 10-15%. This was lysed. It contained a 2 cm cyst that contained brown contents but had a  corpus luteum-like consistency. This was sent to pathology. In addition there was a 1 cm chocolate cyst in the right ovary that was incised open and the lining was ablated with the electrosurgical needle. The sigmoid and the rectum serosa also contained carbon artifacts, as well as a few fresh lesions of endometriosis. All of these lesions were carefully ablated with the needle tip electrode in cutting mode.  Description of the procedure: The patient was placed in dorsal supine position and general endotracheal anesthesia was given. 2 g of cefazolin were given intravenously for prophylaxis. Patient was placed in lithotomy position. She was prepped and draped inside manner.  The bladder was straight catheterized . A ZUMI catheter was placed into the uterine cavity. Uterus sounded to 7 cm. The surgeon was regloved and a surgical field was created on the abdomen.  After preemptive anesthesia of all surgical sites with 0.25% bupivacaine , a 5 mm intraumbilical skin incision was made and a Verress needle was inserted. Its correct location was confirmed. A pneumoperitoneum was created with carbon dioxide.  5 mm laparoscope with a 30 lens was inserted and video laparoscopy was started . A left lower quadrant 5 mm and a right lower quadrant  5 mm incisions were made and ancillary trochars were placed under direct visualization. Above findings were noted.  Using a harmonic ACE, I first took down the anterior abdominal wall adhesions, and the pelvic inlet adhesions.  Using a needle electrode on 35 W of cutting current, the left ovarian adhesiolyse this was  started. Blunt dissection was also performed. The extraovarian left-sided endometrioma was encountered and drained. I did not further adhesiolyse this because of the cohesive nature of the adhesions and its proximity to left ureter.   Bowel serosa lesions were carefully ablated in cutting mode with the tip of the needle electrode. Similarly the fresh lesions in the posterior cul-de-sac peritoneum were also ablated.  The right ovary was separated from its adhesions by blunt and sharp dissection. The 2 cm cyst was opened and its wall was carefully dissected free and submitted to pathology. The smaller endometrioma was cut open with needle electrode and the lining was ablated.  Chromotubation was performed with the above findings. Pelvis was copiously irrigated with lactated Ringer's aspirated and irrigated again. Hemostasis and instrument count was checked.  The incisions were approximated with 4-0 Monocryl in subcuticular sutures.   The patient tolerated the procedure well and was transferred to recovery room in satisfactory condition.

## 2013-09-20 NOTE — Anesthesia Preprocedure Evaluation (Addendum)
Anesthesia Evaluation  Patient identified by MRN, date of birth, ID band Patient awake    Reviewed: Allergy & Precautions, H&P , NPO status , Patient's Chart, lab work & pertinent test results, reviewed documented beta blocker date and time   History of Anesthesia Complications (+) PONV and history of anesthetic complications  Airway Mallampati: II TM Distance: >3 FB Neck ROM: full    Dental  (+) Teeth Intact, Dental Advisory Given   Pulmonary asthma , former smoker,  breath sounds clear to auscultation        Cardiovascular negative cardio ROS  Rhythm:regular Rate:Normal     Neuro/Psych PSYCHIATRIC DISORDERS (anxiety, depression, anorexia) Anxiety Bipolar Disorder negative neurological ROS     GI/Hepatic Neg liver ROS, GERD- (with pregnancy)  ,  Endo/Other  negative endocrine ROS  Renal/GU Kidney stones  negative genitourinary   Musculoskeletal   Abdominal   Peds  Hematology negative hematology ROS (+)   Anesthesia Other Findings   Reproductive/Obstetrics negative OB ROS                       Anesthesia Physical  Anesthesia Plan  ASA: II  Anesthesia Plan: General   Post-op Pain Management:    Induction: Intravenous  Airway Management Planned: Oral ETT  Additional Equipment:   Intra-op Plan:   Post-operative Plan: Extubation in OR  Informed Consent: I have reviewed the patients History and Physical, chart, labs and discussed the procedure including the risks, benefits and alternatives for the proposed anesthesia with the patient or authorized representative who has indicated his/her understanding and acceptance.   Dental advisory given  Plan Discussed with: CRNA and Surgeon  Anesthesia Plan Comments:         Anesthesia Quick Evaluation

## 2013-09-20 NOTE — Transfer of Care (Signed)
Immediate Anesthesia Transfer of Care Note  Patient: Tracy Harris  Procedure(s) Performed: Procedure(s): LAPAROSCOPY LYSIS OF ADHESIONS,  EXCISION OF ENDOMETRIOSIS RIGHT OVARIAN CYSTECTOMY. (N/A)  Patient Location: PACU  Anesthesia Type:General  Level of Consciousness: oriented, sedated and patient cooperative  Airway & Oxygen Therapy: Patient Spontanous Breathing and Patient connected to nasal cannula oxygen  Post-op Assessment: Report given to PACU RN and Post -op Vital signs reviewed and stable  Post vital signs: Reviewed and stable  Complications: No apparent anesthesia complications

## 2013-09-25 ENCOUNTER — Encounter (HOSPITAL_BASED_OUTPATIENT_CLINIC_OR_DEPARTMENT_OTHER): Payer: Self-pay | Admitting: Obstetrics and Gynecology

## 2013-12-25 ENCOUNTER — Encounter (HOSPITAL_BASED_OUTPATIENT_CLINIC_OR_DEPARTMENT_OTHER): Payer: Self-pay | Admitting: Obstetrics and Gynecology

## 2015-11-21 ENCOUNTER — Encounter (HOSPITAL_COMMUNITY): Payer: Self-pay

## 2016-02-21 ENCOUNTER — Encounter (HOSPITAL_COMMUNITY): Payer: Self-pay

## 2016-02-24 HISTORY — DX: Maternal care for unspecified type scar from previous cesarean delivery: O34.219

## 2016-03-03 ENCOUNTER — Other Ambulatory Visit (HOSPITAL_COMMUNITY): Payer: Self-pay | Admitting: Obstetrics and Gynecology

## 2016-03-03 DIAGNOSIS — O09523 Supervision of elderly multigravida, third trimester: Secondary | ICD-10-CM

## 2016-03-03 DIAGNOSIS — O30003 Twin pregnancy, unspecified number of placenta and unspecified number of amniotic sacs, third trimester: Secondary | ICD-10-CM

## 2016-03-03 DIAGNOSIS — R9389 Abnormal findings on diagnostic imaging of other specified body structures: Secondary | ICD-10-CM

## 2016-03-03 DIAGNOSIS — IMO0002 Reserved for concepts with insufficient information to code with codable children: Secondary | ICD-10-CM

## 2016-03-04 ENCOUNTER — Encounter (HOSPITAL_COMMUNITY): Payer: Self-pay | Admitting: *Deleted

## 2016-03-05 ENCOUNTER — Other Ambulatory Visit (HOSPITAL_COMMUNITY): Payer: Self-pay | Admitting: Obstetrics and Gynecology

## 2016-03-05 ENCOUNTER — Ambulatory Visit (HOSPITAL_COMMUNITY)
Admission: RE | Admit: 2016-03-05 | Discharge: 2016-03-05 | Disposition: A | Discharge status: Home / Self Care | Payer: BC Managed Care – PPO | Source: Ambulatory Visit | Attending: Family Medicine | Admitting: Family Medicine

## 2016-03-05 ENCOUNTER — Encounter (HOSPITAL_COMMUNITY): Payer: Self-pay

## 2016-03-05 ENCOUNTER — Ambulatory Visit (HOSPITAL_COMMUNITY): Payer: BC Managed Care – PPO

## 2016-03-05 DIAGNOSIS — O283 Abnormal ultrasonic finding on antenatal screening of mother: Secondary | ICD-10-CM | POA: Diagnosis not present

## 2016-03-05 DIAGNOSIS — O09523 Supervision of elderly multigravida, third trimester: Secondary | ICD-10-CM | POA: Insufficient documentation

## 2016-03-05 DIAGNOSIS — O365931 Maternal care for other known or suspected poor fetal growth, third trimester, fetus 1: Secondary | ICD-10-CM | POA: Insufficient documentation

## 2016-03-05 DIAGNOSIS — O30043 Twin pregnancy, dichorionic/diamniotic, third trimester: Secondary | ICD-10-CM | POA: Diagnosis not present

## 2016-03-05 DIAGNOSIS — Z363 Encounter for antenatal screening for malformations: Secondary | ICD-10-CM | POA: Insufficient documentation

## 2016-03-05 DIAGNOSIS — R9389 Abnormal findings on diagnostic imaging of other specified body structures: Secondary | ICD-10-CM

## 2016-03-05 DIAGNOSIS — O09813 Supervision of pregnancy resulting from assisted reproductive technology, third trimester: Secondary | ICD-10-CM | POA: Insufficient documentation

## 2016-03-05 DIAGNOSIS — Z3A28 28 weeks gestation of pregnancy: Secondary | ICD-10-CM | POA: Insufficient documentation

## 2016-03-05 DIAGNOSIS — O30003 Twin pregnancy, unspecified number of placenta and unspecified number of amniotic sacs, third trimester: Secondary | ICD-10-CM

## 2016-03-05 DIAGNOSIS — O4443 Low lying placenta NOS or without hemorrhage, third trimester: Secondary | ICD-10-CM | POA: Diagnosis not present

## 2016-03-05 DIAGNOSIS — IMO0002 Reserved for concepts with insufficient information to code with codable children: Secondary | ICD-10-CM

## 2016-03-06 ENCOUNTER — Other Ambulatory Visit (HOSPITAL_COMMUNITY): Payer: Self-pay | Admitting: *Deleted

## 2016-03-06 DIAGNOSIS — O30042 Twin pregnancy, dichorionic/diamniotic, second trimester: Secondary | ICD-10-CM

## 2016-03-07 ENCOUNTER — Observation Stay (HOSPITAL_COMMUNITY): Payer: BC Managed Care – PPO

## 2016-03-07 ENCOUNTER — Observation Stay (HOSPITAL_COMMUNITY)
Admission: AD | Admit: 2016-03-07 | Discharge: 2016-03-07 | Disposition: A | Discharge status: Home / Self Care | Payer: BC Managed Care – PPO | Source: Ambulatory Visit | Attending: Obstetrics & Gynecology | Admitting: Obstetrics & Gynecology

## 2016-03-07 ENCOUNTER — Encounter (HOSPITAL_COMMUNITY): Payer: Self-pay | Admitting: *Deleted

## 2016-03-07 DIAGNOSIS — Z3A28 28 weeks gestation of pregnancy: Secondary | ICD-10-CM | POA: Diagnosis not present

## 2016-03-07 DIAGNOSIS — O365932 Maternal care for other known or suspected poor fetal growth, third trimester, fetus 2: Secondary | ICD-10-CM

## 2016-03-07 DIAGNOSIS — Z888 Allergy status to other drugs, medicaments and biological substances status: Secondary | ICD-10-CM | POA: Diagnosis not present

## 2016-03-07 DIAGNOSIS — O34219 Maternal care for unspecified type scar from previous cesarean delivery: Secondary | ICD-10-CM | POA: Insufficient documentation

## 2016-03-07 DIAGNOSIS — Z87442 Personal history of urinary calculi: Secondary | ICD-10-CM | POA: Diagnosis not present

## 2016-03-07 DIAGNOSIS — O26833 Pregnancy related renal disease, third trimester: Secondary | ICD-10-CM | POA: Diagnosis not present

## 2016-03-07 DIAGNOSIS — O99343 Other mental disorders complicating pregnancy, third trimester: Secondary | ICD-10-CM | POA: Diagnosis not present

## 2016-03-07 DIAGNOSIS — Z881 Allergy status to other antibiotic agents status: Secondary | ICD-10-CM | POA: Diagnosis not present

## 2016-03-07 DIAGNOSIS — Z87891 Personal history of nicotine dependence: Secondary | ICD-10-CM | POA: Diagnosis not present

## 2016-03-07 DIAGNOSIS — O30003 Twin pregnancy, unspecified number of placenta and unspecified number of amniotic sacs, third trimester: Secondary | ICD-10-CM

## 2016-03-07 DIAGNOSIS — O4703 False labor before 37 completed weeks of gestation, third trimester: Principal | ICD-10-CM | POA: Diagnosis present

## 2016-03-07 DIAGNOSIS — O09523 Supervision of elderly multigravida, third trimester: Secondary | ICD-10-CM | POA: Diagnosis not present

## 2016-03-07 DIAGNOSIS — O99613 Diseases of the digestive system complicating pregnancy, third trimester: Secondary | ICD-10-CM | POA: Diagnosis not present

## 2016-03-07 DIAGNOSIS — O30043 Twin pregnancy, dichorionic/diamniotic, third trimester: Secondary | ICD-10-CM | POA: Diagnosis not present

## 2016-03-07 DIAGNOSIS — O0903 Supervision of pregnancy with history of infertility, third trimester: Secondary | ICD-10-CM | POA: Diagnosis not present

## 2016-03-07 DIAGNOSIS — O26893 Other specified pregnancy related conditions, third trimester: Secondary | ICD-10-CM | POA: Diagnosis present

## 2016-03-07 DIAGNOSIS — F319 Bipolar disorder, unspecified: Secondary | ICD-10-CM | POA: Insufficient documentation

## 2016-03-07 DIAGNOSIS — J45909 Unspecified asthma, uncomplicated: Secondary | ICD-10-CM | POA: Insufficient documentation

## 2016-03-07 DIAGNOSIS — N1330 Unspecified hydronephrosis: Secondary | ICD-10-CM | POA: Insufficient documentation

## 2016-03-07 DIAGNOSIS — O99513 Diseases of the respiratory system complicating pregnancy, third trimester: Secondary | ICD-10-CM | POA: Insufficient documentation

## 2016-03-07 DIAGNOSIS — K219 Gastro-esophageal reflux disease without esophagitis: Secondary | ICD-10-CM | POA: Insufficient documentation

## 2016-03-07 DIAGNOSIS — O09813 Supervision of pregnancy resulting from assisted reproductive technology, third trimester: Secondary | ICD-10-CM | POA: Diagnosis not present

## 2016-03-07 DIAGNOSIS — Z3490 Encounter for supervision of normal pregnancy, unspecified, unspecified trimester: Secondary | ICD-10-CM

## 2016-03-07 DIAGNOSIS — O365931 Maternal care for other known or suspected poor fetal growth, third trimester, fetus 1: Secondary | ICD-10-CM

## 2016-03-07 LAB — URINALYSIS, ROUTINE W REFLEX MICROSCOPIC
Bilirubin Urine: NEGATIVE
Glucose, UA: NEGATIVE mg/dL
Ketones, ur: NEGATIVE mg/dL
Leukocytes, UA: NEGATIVE
Nitrite: NEGATIVE
PROTEIN: NEGATIVE mg/dL
SPECIFIC GRAVITY, URINE: 1.011 (ref 1.005–1.030)
pH: 5 (ref 5.0–8.0)

## 2016-03-07 LAB — TYPE AND SCREEN
ABO/RH(D): O POS
ANTIBODY SCREEN: NEGATIVE

## 2016-03-07 MED ORDER — BETAMETHASONE SOD PHOS & ACET 6 (3-3) MG/ML IJ SUSP
12.0000 mg | Freq: Once | INTRAMUSCULAR | Status: AC
  Administered 2016-03-07: 12 mg via INTRAMUSCULAR
  Filled 2016-03-07: qty 2

## 2016-03-07 MED ORDER — DOCUSATE SODIUM 100 MG PO CAPS
100.0000 mg | ORAL_CAPSULE | Freq: Every day | ORAL | Status: DC
  Administered 2016-03-07: 100 mg via ORAL
  Filled 2016-03-07: qty 1

## 2016-03-07 MED ORDER — ZOLPIDEM TARTRATE 5 MG PO TABS: 5.0000 mg | ORAL_TABLET | Freq: Every evening | ORAL | Status: DC | PRN

## 2016-03-07 MED ORDER — MORPHINE SULFATE (PF) 4 MG/ML IV SOLN
2.0000 mg | INTRAVENOUS | Status: DC | PRN
  Administered 2016-03-07 (×2): 2 mg via INTRAVENOUS
  Filled 2016-03-07 (×2): qty 1

## 2016-03-07 MED ORDER — CALCIUM CARBONATE ANTACID 500 MG PO CHEW: 2.0000 | CHEWABLE_TABLET | ORAL | Status: DC | PRN

## 2016-03-07 MED ORDER — ACETAMINOPHEN 325 MG PO TABS: 650.0000 mg | ORAL_TABLET | ORAL | Status: DC | PRN

## 2016-03-07 MED ORDER — BUPROPION HCL 100 MG PO TABS
200.0000 mg | ORAL_TABLET | Freq: Once | ORAL | Status: AC
  Administered 2016-03-07: 200 mg via ORAL
  Filled 2016-03-07: qty 2

## 2016-03-07 MED ORDER — PRENATAL MULTIVITAMIN CH
1.0000 | ORAL_TABLET | Freq: Every day | ORAL | Status: DC
  Administered 2016-03-07: 1 via ORAL
  Filled 2016-03-07: qty 1

## 2016-03-07 MED ORDER — OXYCODONE-ACETAMINOPHEN 5-325 MG PO TABS
2.0000 | ORAL_TABLET | ORAL | Status: DC | PRN
  Administered 2016-03-07: 2 via ORAL
  Filled 2016-03-07: qty 2

## 2016-03-07 MED ORDER — PANTOPRAZOLE SODIUM 40 MG PO TBEC
40.0000 mg | DELAYED_RELEASE_TABLET | Freq: Every day | ORAL | Status: DC
  Administered 2016-03-07: 40 mg via ORAL
  Filled 2016-03-07 (×2): qty 1

## 2016-03-07 MED ORDER — MAGNESIUM SULFATE 50 % IJ SOLN
2.0000 g/h | Freq: Once | INTRAVENOUS | Status: AC
  Administered 2016-03-07: 1 g/h via INTRAVENOUS
  Administered 2016-03-07: 2 g/h via INTRAVENOUS
  Filled 2016-03-07: qty 80

## 2016-03-07 MED ORDER — LACTATED RINGERS IV SOLN
INTRAVENOUS | Status: DC
  Administered 2016-03-07 (×2): via INTRAVENOUS

## 2016-03-07 MED ORDER — OXYCODONE-ACETAMINOPHEN 7.5-325 MG PO TABS: 1.0000 | ORAL_TABLET | ORAL | 0 refills | Status: DC | PRN

## 2016-03-07 NOTE — Discharge Summary (Signed)
ANTENATAL DISCHARGE SUMMARY  Patient ID: Tracy Harris MRN: [id number] DOB/AGE: 38/08/1980 37 y.o.  Admit date: 03/07/2016 Discharge date: 03/07/2016  Admission Diagnoses: 28 5/7 wks Twin with severe back pain, probable Kidney stones  Discharge Diagnoses: 28 5/7 wks Twin with severe back pain, probable Kidney stones         Discharged Condition: Improved  Hospital Course: Decreased pain with IV Hydration  Consults: None and MFM  Treatments: IV hydration, analgesia: Morphine and Percocet  Disposition: home   Allergies as of 03/07/2016      Reactions   Ciprofloxacin Nausea Only   Other Other (See Comments)   Seprafilm medication used in abdomin  last laparoscopy surgery caused extreme swelling and pain.      Medication List    STOP taking these medications   omeprazole 20 MG capsule Commonly known as:  PRILOSEC   traMADol 50 MG tablet Commonly known as:  ULTRAM     TAKE these medications   ALPRAZolam 1 MG tablet Commonly known as:  XANAX Take 1 mg by mouth 3 (three) times daily as needed for anxiety.   aspirin-acetaminophen-caffeine 250-250-65 MG tablet Commonly known as:  EXCEDRIN MIGRAINE Take 1 tablet by mouth every 6 (six) hours as needed for headache.   buPROPion 100 MG tablet Commonly known as:  WELLBUTRIN Take 200-300 mg by mouth 2 (two) times daily. 200mg  in am and 300mg  qhs   cyclobenzaprine 10 MG tablet Commonly known as:  FLEXERIL Take 10 mg by mouth 3 (three) times daily as needed for muscle spasms.   DICLEGIS PO Take by mouth.   fluticasone 50 MCG/ACT nasal spray Commonly known as:  FLONASE Place into both nostrils as needed for allergies or rhinitis.   FOLIC ACID PO Take by mouth.   HYDROcodone-acetaminophen 5-325 MG tablet Commonly known as:  NORCO/VICODIN Take 1 tablet by mouth every 6 (six) hours as needed for moderate pain.   montelukast 10 MG tablet Commonly known as:  SINGULAIR Take 10 mg by mouth as needed.    nitrofurantoin (macrocrystal-monohydrate) 100 MG capsule Commonly known as:  MACROBID Take 100 mg by mouth as needed.   ondansetron 4 MG tablet Commonly known as:  ZOFRAN Take 1 tablet (4 mg total) by mouth every 8 (eight) hours as needed for nausea or vomiting.   oxyCODONE-acetaminophen 7.5-325 MG tablet Commonly known as:  PERCOCET Take 1 tablet by mouth every 4 (four) hours as needed for severe pain. What changed:  reasons to take this   prenatal vitamin w/FE, FA 27-1 MG Tabs tablet Take 1 tablet by mouth daily at 12 noon.   PROTONIX PO Take by mouth.   QUEtiapine 300 MG 24 hr tablet Commonly known as:  SEROQUEL XR Take 600 mg by mouth at bedtime.   valACYclovir 500 MG tablet Commonly known as:  VALTREX Take 500 mg by mouth daily.   VENTOLIN IN Inhale into the lungs as needed.   zolpidem 10 MG tablet Commonly known as:  AMBIEN Take 10 mg by mouth at bedtime.      Follow-up Information    COUSINS,SHERONETTE A, MD Follow up.   Specialty:  Obstetrics and Gynecology Contact information: [address] [place] [address] [phone]           Signed: Genia DelMarie-Lyne Timohty Renbarger, MD MD 03/07/2016, 3:51 PM

## 2016-03-07 NOTE — H&P (Signed)
Tracy Harris is a 36 y.o. female G2P1001 5438w5d Twin, transferred for PTL/Severe Back Pain.  HPI/HPP:  Had severe back pain with UCs [place]  Mild blood in urine.  No fever.  H/O kidney stones seen in 12/2015 by Dr Katy Apotellin.  Had UCs for which she was given Terbutaline/MgSO4/BMetha x 1 dose.  UCs spaced and cervix remained Closed/Long.  [facility] am.  No evidence of PTL.  Severe bilateral back pain persists, coming in peaks and waning slightly at times.  Per patient, same as previous kidney stone pain.  OB History    Gravida Para Term Preterm AB Living   2 1 1     1    SAB TAB Ectopic Multiple Live Births           1     Past Medical History:  Diagnosis Date  . Anxiety   . Bipolar 1 disorder (HCC)   . Endometriosis   . GERD (gastroesophageal reflux disease)   . History of anorexia nervosa    hospitalized age 36 and [age]  . History of kidney stones   . History of positive PPD   . Mild asthma   . PONV (postoperative nausea and vomiting)   . Wears contact lenses    Past Surgical History:  Procedure Laterality Date  . CESAREAN SECTION  02/11/2011   Procedure: CESAREAN SECTION;  Surgeon: Serita KyleSheronette A Cousins, MD;  Location: [facility];  Service: Gynecology;  Laterality: N/A;  . DX  LAPAROSCOPY/  BX LEFT OVARIAN CYST/  ABLATION AND EXCISION ENDOMETRIOSIS/ LYSIS OF ADHESIONS  03-21-2010   and chromopurtubation  . LAPAROSCOPY N/A 09/20/2013   Procedure: LAPAROSCOPY LYSIS OF ADHESIONS,  EXCISION OF ENDOMETRIOSIS RIGHT OVARIAN CYSTECTOMY.;  Surgeon: Fermin Schwabamer Yalcinkaya, MD;  Location: [facility];  Service: Gynecology;  Laterality: N/A;  . LASER ABLATION HPV , VAGINA, CERVIX, PERIANAL , AND VULVA  08-15-2002  . TONSILLECTOMY  2011   Family History: family history is not on file. Social History:  reports that she quit smoking about 12 years ago. Her smoking use included Cigarettes. She has a 10.00 pack-year smoking history.  She has never used smokeless tobacco. She reports that she drinks alcohol. She reports that she does not use drugs.  Allergies  Allergen Reactions  . Ciprofloxacin Nausea Only  . Other Other (See Comments)    Seprafilm medication used in abdomin  last laparoscopy surgery caused extreme swelling and pain.    Dilation: Closed Effacement (%): Thick Station: Ballotable Exam by:: Dr. Seymour BarsLavoie   Blood pressure 123/80, pulse 94, temperature 98.4 F (36.9 C), temperature source Oral, resp. rate 16, height 5\' 4"  (1.626 Harris), weight 180 lb (81.6 kg), SpO2 97 %. Exam Physical Exam   CVAT pos bilat.  FHR A Base line 140's with mild variability, no deceleration FHR B Base line 130's with mild variability, no deceleration No reg UC.  Urine with blood  HPP:  Patient Active Problem List   Diagnosis Date Noted  . Preterm uterine contractions in third trimester, antepartum 03/07/2016  . PP care - C/S 12/19 02/12/2011    Prenatal labs: ABO, Rh: --/--/O POS (01/13 [id number]) Antibody: NEG (01/13 0721) Rubella: Immune RPR:  NR  HBsAg:  NR  HIV:  NR  Genetic testing:  Ultrascreen wnl x 2.   US anato: wnl except Lt Ventriculomegaly in fetus B.  US MFM Ventricles wnl.  Growth discordance.   1 hr GTT: wnl  GBS:  Not done  [place] MFM on 03/05/2016: Dichorionic/diamniotic twin pregnancy at 28+3 weeks  Twin A:  Normal detailed fetal anatomy  Normal amniotic fluid volume  Measurements consistent with IVF dating; EFW at the 26th  %tile; AC at the 5th %tile  UA dopplers were normal for this GA  Twin B:  Normal detailed fetal anatomy; limited views of face and  spine; left lateral ventricle measured 8.5 mms (WNL)  Normal amniotic fluid volume  Measurements consistent with IVF dating; EFW at the 62nd  %tile  EV views of cervix: normal length without funneling; no previa  The US findings were shared with [name] and her  husband. The implications of borderline ventricular  measurements and  possible early fetal growth restriction  were discussed in detail. ---------------------------------------------------------------------- Recommendations  Follow-up ultrasound for growth in 3 weeks  Assessment/Plan: G2P1 28 5/7 wks Twin Gestation with severe back pain c/w Kidney/Ureteral Stones.  H/O Kidney stones followed by Dr Katy Apo.  Will do Renal US ASAP.  Push IV 150 cc/hr RL when MgSO4 d/ced and PO Hydration.  Pain control with Morphine IV.  Uro consult per Renal US results.  No evidence of PTL currently, no reg UC/Cervix Closed/long, d/c MgSO4.  BMetha 2nd dose now.  Continuous monitoring.  Ob US for BPP, Growth discordance per last [place] with MFM with fetus A 26% (AC 5%) and fetus B 62%. Patient with Bipolar disorder and anxiety on Wellbutrin and Seroquel.  Tracy Harris 03/07/2016, 9:16 AM

## 2016-03-07 NOTE — MAU Provider Note (Signed)
History     CSN: [id number]  Arrival date and time: 03/07/16 4098   First Provider Initiated Contact with Patient 03/07/16 0510      Chief Complaint  Patient presents with  . Contractions   HPI Tracy Harris is a 36 y.o. G2P1001 at [redacted]w[redacted]d with di/di twins who presents with contractions. Patient is a transfer from [facility]. [place] started having contractions. Noticed painful ctx every 5-10 minutes at around 7 pm yesterday evening. Went to hospital; cervix was closed. Received BMZ & started on magnesium sulfate.  States initially ctx improved with medication but during her transfer pain returned. Still feeling contractions every 10 minutes and rates them 8/10 on pain scale. Denies n/v/d, constipation, dysuria, vaginal bleeding, or LOF. Positive fetal movement.   OB History    Gravida Para Term Preterm AB Living   2 1 1     1    SAB TAB Ectopic Multiple Live Births           1      Past Medical History:  Diagnosis Date  . Anxiety   . Bipolar 1 disorder (HCC)   . Endometriosis   . GERD (gastroesophageal reflux disease)   . History of anorexia nervosa    hospitalized age 49 and 31  . History of kidney stones   . History of positive PPD   . Mild asthma   . PONV (postoperative nausea and vomiting)   . Wears contact lenses     Past Surgical History:  Procedure Laterality Date  . CESAREAN SECTION  02/11/2011   Procedure: CESAREAN SECTION;  Surgeon: Serita Kyle, MD;  Location: [facility];  Service: Gynecology;  Laterality: N/A;  . DX  LAPAROSCOPY/  BX LEFT OVARIAN CYST/  ABLATION AND EXCISION ENDOMETRIOSIS/ LYSIS OF ADHESIONS  03-21-2010   and chromopurtubation  . LAPAROSCOPY N/A 09/20/2013   Procedure: LAPAROSCOPY LYSIS OF ADHESIONS,  EXCISION OF ENDOMETRIOSIS RIGHT OVARIAN CYSTECTOMY.;  Surgeon: Fermin Schwab, MD;  Location: [facility];  Service: Gynecology;  Laterality: N/A;  . LASER ABLATION HPV ,  VAGINA, CERVIX, PERIANAL , AND VULVA  08-15-2002  . TONSILLECTOMY  2011    History reviewed. No pertinent family history.  Social History  Substance Use Topics  . Smoking status: Former Smoker    Packs/day: 1.00    Years: 10.00    Types: Cigarettes    Quit date: 09/16/2003  . Smokeless tobacco: Never Used  . Alcohol use Yes     Comment: rare    Allergies:  Allergies  Allergen Reactions  . Ciprofloxacin Nausea Only  . Other Other (See Comments)    Seprafilm medication used in abdomin  last laparoscopy surgery caused extreme swelling and pain.    Prescriptions Prior to Admission  Medication Sig Dispense Refill Last Dose  . buPROPion (WELLBUTRIN) 100 MG tablet Take 200-300 mg by mouth 2 (two) times daily. 200mg  in am and 300mg  qhs   03/06/2016 at Unknown time  . cyclobenzaprine (FLEXERIL) 10 MG tablet Take 10 mg by mouth 3 (three) times daily as needed for muscle spasms.   03/06/2016 at Unknown time  . Doxylamine-Pyridoxine (DICLEGIS PO) Take by mouth.   03/06/2016 at Unknown time  . FOLIC ACID PO Take by mouth.   03/06/2016 at Unknown time  . HYDROcodone-acetaminophen (NORCO/VICODIN) 5-325 MG tablet Take 1 tablet by mouth every 6 (six) hours as needed for moderate pain.   03/06/2016 at 2000  . ondansetron (  ZOFRAN) 4 MG tablet Take 1 tablet (4 mg total) by mouth every 8 (eight) hours as needed for nausea or vomiting. 20 tablet 0 03/06/2016 at 1700  . Pantoprazole Sodium (PROTONIX PO) Take by mouth.   03/06/2016 at Unknown time  . prenatal vitamin w/FE, FA (PRENATAL 1 + 1) 27-1 MG TABS tablet Take 1 tablet by mouth daily at 12 noon.   Past Week at Unknown time  . QUEtiapine (SEROQUEL XR) 300 MG 24 hr tablet Take 600 mg by mouth at bedtime.    Past Week at Unknown time  . zolpidem (AMBIEN) 10 MG tablet Take 10 mg by mouth at bedtime.   Past Week at 2100  . Albuterol (VENTOLIN IN) Inhale into the lungs as needed.   Not Taking  . ALPRAZolam (XANAX) 1 MG tablet Take 1 mg by mouth 3 (three)  times daily as needed for anxiety.   Not Taking  . aspirin-acetaminophen-caffeine (EXCEDRIN MIGRAINE) 250-250-65 MG per tablet Take 1 tablet by mouth every 6 (six) hours as needed for headache.   Not Taking  . fluticasone (FLONASE) 50 MCG/ACT nasal spray Place into both nostrils as needed for allergies or rhinitis.   Not Taking  . montelukast (SINGULAIR) 10 MG tablet Take 10 mg by mouth as needed.   Not Taking  . nitrofurantoin, macrocrystal-monohydrate, (MACROBID) 100 MG capsule Take 100 mg by mouth as needed.   Not Taking  . omeprazole (PRILOSEC) 20 MG capsule Take 20 mg by mouth 2 (two) times daily before a meal.   Not Taking  . oxyCODONE-acetaminophen (PERCOCET) 7.5-325 MG per tablet Take 1 tablet by mouth every 4 (four) hours as needed for pain. (Patient not taking: Reported on 03/05/2016) 30 tablet 0 Not Taking  . traMADol (ULTRAM) 50 MG tablet Take 50 mg by mouth every 8 (eight) hours as needed.   Not Taking  . valACYclovir (VALTREX) 500 MG tablet Take 500 mg by mouth daily.   Taking    Review of Systems  Constitutional: Negative.   Gastrointestinal: Positive for abdominal pain. Negative for constipation, diarrhea, nausea and vomiting.  Genitourinary: Negative.  Negative for vaginal bleeding and vaginal discharge.  Musculoskeletal: Positive for gait problem (d/t right hip pain).   Physical Exam   Blood pressure 122/73, pulse 84, temperature 98.2 F (36.8 C), resp. rate 18, SpO2 96 %.  Physical Exam  Nursing note and vitals reviewed. Constitutional: She is oriented to person, place, and time. She appears well-developed and well-nourished. No distress.  HENT:  Head: Normocephalic and atraumatic.  Eyes: Conjunctivae are normal. Right eye exhibits no discharge. Left eye exhibits no discharge. No scleral icterus.  Neck: Normal range of motion.  Respiratory: Effort normal. No respiratory distress.  GI: Soft. There is no tenderness.  Neurological: She is alert and oriented to person,  place, and time.  Skin: Skin is warm and dry. She is not diaphoretic.  Psychiatric: She has a normal mood and affect. Her behavior is normal. Judgment and thought content normal.   Dilation: Closed Effacement (%): Thick Cervical Position: Posterior Exam by:: Judeth Horn NP  Fetal Tracing: Baby A Baseline: 140 Variability: minimal Accelerations: none Decelerations: none   Baby B Baseline: 135 Variability: minimal with some areas of moderate Accelerations: none Decelerations: none   Toco: few ctx & some UI, difficult to assess, TOCO adjusted   MAU Course  Procedures Results for orders placed or performed during the hospital encounter of 03/07/16 (from the past 24 hour(s))  Urinalysis, Routine w reflex  microscopic     Status: Abnormal   Collection Time: 03/07/16  4:40 AM  Result Value Ref Range   Color, Urine YELLOW YELLOW   APPearance CLEAR CLEAR   Specific Gravity, Urine 1.011 1.005 - 1.030   pH 5.0 5.0 - 8.0   Glucose, UA NEGATIVE NEGATIVE mg/dL   Hgb urine dipstick SMALL (A) NEGATIVE   Bilirubin Urine NEGATIVE NEGATIVE   Ketones, ur NEGATIVE NEGATIVE mg/dL   Protein, ur NEGATIVE NEGATIVE mg/dL   Nitrite NEGATIVE NEGATIVE   Leukocytes, UA NEGATIVE NEGATIVE   RBC / HPF 0-5 0 - 5 RBC/hpf   WBC, UA 0-5 0 - 5 WBC/hpf   Bacteria, UA RARE (A) NONE SEEN   Squamous Epithelial / LPF 0-5 (A) NONE SEEN   Mucous PRESENT     MDM Cervix closed S/w Dr. Seymour BarsLavoie -- will keep for observation Assessment and Plan  A; 1. Preterm uterine contractions, antepartum, third trimester   2. Twin pregnancy, dichorionic/diamniotic, third trimester    P; Keep for antenatal observation Mag sulfate 2 gm/hr until 11 am Ultrasound for growth, CL, & fetal well being after mag d/c'd   Judeth HornErin Adlean Hardeman 03/07/2016, 5:10 AM

## 2016-03-07 NOTE — MAU Note (Signed)
Pt transferred from [facility] by EMS. WAs seen there for ctxs. Is pt of Dr Cherly Hensenousins. Dr Seymour BarsLavoie wanted pt to be seen in MAU.

## 2016-03-07 NOTE — Discharge Instructions (Signed)
Kidney Stones  Kidney stones (urolithiasis) are solid, rock-like deposits that form inside of the organs that make urine (kidneys). A kidney stone may form in a kidney and move into the bladder, where it can cause intense pain and block the flow of urine. Kidney stones are created when high levels of certain minerals are found in the urine. They are usually passed through urination, but in some cases, medical treatment may be needed to remove them.  What are the causes?  Kidney stones may be caused by:  · A condition in which certain glands produce too much parathyroid hormone (primary hyperparathyroidism), which causes too much calcium buildup in the blood.  · Buildup of uric acid crystals in the bladder (hyperuricosuria). Uric acid is a chemical that the body produces when you eat certain foods. It usually exits the body in the urine.  · Narrowing (stricture) of one or both of the tubes that drain urine from the kidneys to the bladder (ureters).  · A kidney blockage that is present at birth (congenital obstruction).  · Past surgery on the kidney or the ureters, such as gastric bypass surgery.    What increases the risk?  The following factors make you more likely to develop kidney stones:  · Having had a kidney stone in the past.  · Having a family history of kidney stones.  · Not drinking enough water.  · Eating a diet that is high in protein, salt (sodium), or sugar.  · Being overweight or obese.    What are the signs or symptoms?  Symptoms of a kidney stone may include:  · Nausea.  · Vomiting.  · Blood in the urine (hematuria).  · Pain in the side of the abdomen, right below the ribs (flank pain). Pain usually spreads (radiates) to the groin.  · Needing to urinate frequently or urgently.    How is this diagnosed?  This condition may be diagnosed based on:  · Your medical history.  · A physical exam.  · Blood tests.  · Urine tests.  · CT scan.  · Abdominal X-ray.  · A procedure to examine the inside of the  bladder (cystoscopy).    How is this treated?  Treatment for kidney stones depends on the size, location, and makeup of the stones. Treatment may involve:  · Analyzing your urine before and after you pass the stone through urination.  · Being monitored at the hospital until you pass the stone through urination.  · Increasing your fluid intake and decreasing the amount of calcium and protein in your diet.  · A procedure to break up kidney stones in the bladder using:  ? A focused beam of light (laser therapy).  ? Shock waves (extracorporeal shock wave lithotripsy).  · Surgery to remove kidney stones. This may be needed if you have severe pain or have stones that block your urinary tract.    Follow these instructions at home:  Eating and drinking     · Drink enough fluid to keep your urine clear or pale yellow. This will help you to pass the kidney stone.  · If directed, change your diet. This may include:  ? Limiting how much sodium you eat.  ? Eating more fruits and vegetables.  ? Limiting how much meat, poultry, fish, and eggs you eat.  · Follow instructions from your health care provider about eating or drinking restrictions.  General instructions   · Collect urine samples as told by your health care   provider. You may need to collect a urine sample:  ? 24 hours after you pass the stone.  ? 8-12 weeks after passing the kidney stone, and every 6-12 months after that.  · Strain your urine every time you urinate, for as long as directed. Use the strainer that your health care provider recommends.  · Do not throw out the kidney stone after passing it. Keep the stone so it can be tested by your health care provider. Testing the makeup of your kidney stone may help prevent you from getting kidney stones in the future.  · Take over-the-counter and prescription medicines only as told by your health care provider.  · Keep all follow-up visits as told by your health care provider. This is important. You may need follow-up  X-rays or ultrasounds to make sure that your stone has passed.  How is this prevented?  To prevent another kidney stone:  · Drink enough fluid to keep your urine clear or pale yellow. This is the best way to prevent kidney stones.  · Eat a healthy diet and follow recommendations from your health care provider about foods to avoid. You may be instructed to eat a low-protein diet. Recommendations vary depending on the type of kidney stone that you have.  · Maintain a healthy weight.    Contact a health care provider if:  · You have pain that gets worse or does not get better with medicine.  Get help right away if:  · You have a fever or chills.  · You develop severe pain.  · You develop new abdominal pain.  · You faint.  · You are unable to urinate.  This information is not intended to replace advice given to you by your health care provider. Make sure you discuss any questions you have with your health care provider.  Document Released: 02/09/2005 Document Revised: 08/30/2015 Document Reviewed: 07/26/2015  Elsevier Interactive Patient Education © 2017 [employer]

## 2016-03-07 NOTE — Progress Notes (Signed)
Hospital day # 0 pregnancy at 2037w5d Twin Di-Di Severe Back pain.  S: well, reports good fetal activity      Contractions:none      Vaginal bleeding:none       Vaginal discharge: no significant change  O: BP 127/68   Pulse (!) 112   Temp 98.4 F (36.9 C) (Oral)   Resp 20   Ht 5\' 4"  (1.626 m)   Wt 180 lb (81.6 kg)   SpO2 97%   BMI 30.90 kg/m       Fetal tracings:A and B reassuring with good variability, no deceleration      Uterus gravid and non-tender and soft      Extremities: no significant edema and no signs of DVT   Renal US:  Mild bilateral Hydronephrosis US BPP 8/8 x 2  A: 1437w5d with Probable kidney stones.  Much improved pain post IV Hydration.  Renal US Mild bilat. Hydronephrosis.  Pain now controled with Percocet PO.  No evidence of PTL.  Fetal well-being reassuring x 2 with BPP 8/8.     P: D/C home.  F/U Dr Cherly Hensenousins this week.    Genia DelMarie-Lyne Coye Dawood  MD 03/07/2016 4:02 PM

## 2016-03-26 ENCOUNTER — Other Ambulatory Visit (HOSPITAL_COMMUNITY): Payer: Self-pay | Admitting: Maternal and Fetal Medicine

## 2016-03-26 ENCOUNTER — Encounter (HOSPITAL_COMMUNITY): Payer: Self-pay

## 2016-03-26 ENCOUNTER — Ambulatory Visit (HOSPITAL_COMMUNITY)
Admission: RE | Admit: 2016-03-26 | Discharge: 2016-03-26 | Disposition: A | Discharge status: Home / Self Care | Payer: BC Managed Care – PPO | Source: Ambulatory Visit | Attending: Family Medicine | Admitting: Family Medicine

## 2016-03-26 DIAGNOSIS — O30042 Twin pregnancy, dichorionic/diamniotic, second trimester: Secondary | ICD-10-CM

## 2016-03-26 DIAGNOSIS — O09523 Supervision of elderly multigravida, third trimester: Secondary | ICD-10-CM | POA: Insufficient documentation

## 2016-03-26 DIAGNOSIS — O09813 Supervision of pregnancy resulting from assisted reproductive technology, third trimester: Secondary | ICD-10-CM | POA: Insufficient documentation

## 2016-03-26 DIAGNOSIS — O30043 Twin pregnancy, dichorionic/diamniotic, third trimester: Secondary | ICD-10-CM | POA: Diagnosis not present

## 2016-03-26 DIAGNOSIS — Z3A31 31 weeks gestation of pregnancy: Secondary | ICD-10-CM | POA: Insufficient documentation

## 2016-03-27 ENCOUNTER — Other Ambulatory Visit (HOSPITAL_COMMUNITY): Payer: Self-pay | Admitting: *Deleted

## 2016-03-27 DIAGNOSIS — O30043 Twin pregnancy, dichorionic/diamniotic, third trimester: Secondary | ICD-10-CM

## 2016-04-14 ENCOUNTER — Encounter (HOSPITAL_COMMUNITY): Payer: Self-pay

## 2016-04-14 ENCOUNTER — Ambulatory Visit (HOSPITAL_COMMUNITY)
Admission: RE | Admit: 2016-04-14 | Discharge: 2016-04-14 | Disposition: A | Discharge status: Home / Self Care | Payer: BC Managed Care – PPO | Source: Ambulatory Visit | Attending: Family Medicine | Admitting: Family Medicine

## 2016-04-14 DIAGNOSIS — O09813 Supervision of pregnancy resulting from assisted reproductive technology, third trimester: Secondary | ICD-10-CM | POA: Diagnosis not present

## 2016-04-14 DIAGNOSIS — O30043 Twin pregnancy, dichorionic/diamniotic, third trimester: Secondary | ICD-10-CM | POA: Diagnosis not present

## 2016-04-14 DIAGNOSIS — O09523 Supervision of elderly multigravida, third trimester: Secondary | ICD-10-CM | POA: Diagnosis not present

## 2016-04-14 DIAGNOSIS — O34211 Maternal care for low transverse scar from previous cesarean delivery: Secondary | ICD-10-CM | POA: Diagnosis not present

## 2016-04-14 DIAGNOSIS — Z3A34 34 weeks gestation of pregnancy: Secondary | ICD-10-CM | POA: Insufficient documentation

## 2016-04-14 DIAGNOSIS — O365931 Maternal care for other known or suspected poor fetal growth, third trimester, fetus 1: Secondary | ICD-10-CM | POA: Diagnosis not present

## 2016-04-16 ENCOUNTER — Ambulatory Visit (HOSPITAL_COMMUNITY)
Admission: RE | Admit: 2016-04-16 | Discharge: 2016-04-16 | Disposition: A | Discharge status: Home / Self Care | Payer: BC Managed Care – PPO | Source: Ambulatory Visit | Attending: Obstetrics and Gynecology | Admitting: Obstetrics and Gynecology

## 2016-04-16 ENCOUNTER — Ambulatory Visit (HOSPITAL_COMMUNITY): Payer: BC Managed Care – PPO

## 2016-04-16 ENCOUNTER — Other Ambulatory Visit (HOSPITAL_COMMUNITY): Payer: Self-pay | Admitting: Obstetrics & Gynecology

## 2016-04-16 DIAGNOSIS — M79605 Pain in left leg: Secondary | ICD-10-CM

## 2016-04-16 DIAGNOSIS — M7989 Other specified soft tissue disorders: Principal | ICD-10-CM

## 2016-04-16 DIAGNOSIS — O34219 Maternal care for unspecified type scar from previous cesarean delivery: Secondary | ICD-10-CM | POA: Insufficient documentation

## 2016-04-16 DIAGNOSIS — O4443 Low lying placenta NOS or without hemorrhage, third trimester: Secondary | ICD-10-CM | POA: Diagnosis not present

## 2016-04-16 DIAGNOSIS — O09523 Supervision of elderly multigravida, third trimester: Secondary | ICD-10-CM | POA: Insufficient documentation

## 2016-04-16 DIAGNOSIS — O30043 Twin pregnancy, dichorionic/diamniotic, third trimester: Secondary | ICD-10-CM | POA: Insufficient documentation

## 2016-04-16 DIAGNOSIS — Z3A34 34 weeks gestation of pregnancy: Secondary | ICD-10-CM | POA: Diagnosis not present

## 2016-04-16 NOTE — Progress Notes (Signed)
VASCULAR LAB PRELIMINARY  PRELIMINARY  PRELIMINARY  PRELIMINARY  Left lower extremity venous duplex completed.    Preliminary report:  Left:  No evidence of DVT, superficial thrombosis, or Baker's cyst.  Morgan Keinath, RVS 04/16/2016, 1:50 PM

## 2016-04-24 ENCOUNTER — Other Ambulatory Visit: Payer: Self-pay | Admitting: Obstetrics and Gynecology

## 2016-04-30 ENCOUNTER — Ambulatory Visit (HOSPITAL_COMMUNITY)
Admission: RE | Admit: 2016-04-30 | Discharge: 2016-04-30 | Disposition: A | Discharge status: Home / Self Care | Payer: BC Managed Care – PPO | Source: Ambulatory Visit | Attending: Obstetrics and Gynecology | Admitting: Obstetrics and Gynecology

## 2016-04-30 ENCOUNTER — Other Ambulatory Visit (HOSPITAL_COMMUNITY): Payer: Self-pay | Admitting: Obstetrics and Gynecology

## 2016-04-30 DIAGNOSIS — R609 Edema, unspecified: Secondary | ICD-10-CM | POA: Diagnosis not present

## 2016-04-30 DIAGNOSIS — R600 Localized edema: Secondary | ICD-10-CM | POA: Insufficient documentation

## 2016-04-30 DIAGNOSIS — O26893 Other specified pregnancy related conditions, third trimester: Secondary | ICD-10-CM | POA: Insufficient documentation

## 2016-04-30 DIAGNOSIS — M79605 Pain in left leg: Secondary | ICD-10-CM | POA: Diagnosis not present

## 2016-04-30 DIAGNOSIS — O30003 Twin pregnancy, unspecified number of placenta and unspecified number of amniotic sacs, third trimester: Secondary | ICD-10-CM | POA: Diagnosis not present

## 2016-04-30 NOTE — Progress Notes (Signed)
**  Preliminary report by tech**  Left lower extremity venous duplex complete. There is no obvious evidence of deep or superficial vein thrombosis involving the left lower extremity. All clearly visualized vessels appear patent and compressible. There is no evidence of a Baker's cyst on the left. Results were given to Delaney Meigsamara RN at Dr. Cherly Hensenousins' office.  04/30/16 1:53 PM Olen CordialGreg Narvel Kozub RVT

## 2016-05-02 ENCOUNTER — Inpatient Hospital Stay (HOSPITAL_COMMUNITY): Payer: BC Managed Care – PPO | Admitting: Anesthesiology

## 2016-05-02 ENCOUNTER — Encounter (HOSPITAL_COMMUNITY): Payer: Self-pay

## 2016-05-02 ENCOUNTER — Encounter (HOSPITAL_COMMUNITY): Payer: Self-pay | Admitting: Certified Registered Nurse Anesthetist

## 2016-05-02 ENCOUNTER — Encounter (HOSPITAL_COMMUNITY)
Admission: AD | Disposition: A | Discharge status: Home / Self Care | Payer: Self-pay | Source: Ambulatory Visit | Attending: Obstetrics and Gynecology

## 2016-05-02 ENCOUNTER — Inpatient Hospital Stay (HOSPITAL_COMMUNITY)
Admission: AD | Admit: 2016-05-02 | Discharge: 2016-05-05 | DRG: 765 | Disposition: A | Discharge status: Home / Self Care | Payer: BC Managed Care – PPO | Source: Ambulatory Visit | Attending: Obstetrics and Gynecology | Admitting: Obstetrics and Gynecology

## 2016-05-02 DIAGNOSIS — D62 Acute posthemorrhagic anemia: Secondary | ICD-10-CM | POA: Diagnosis not present

## 2016-05-02 DIAGNOSIS — O9081 Anemia of the puerperium: Secondary | ICD-10-CM | POA: Diagnosis not present

## 2016-05-02 DIAGNOSIS — O99214 Obesity complicating childbirth: Secondary | ICD-10-CM | POA: Diagnosis present

## 2016-05-02 DIAGNOSIS — O99344 Other mental disorders complicating childbirth: Secondary | ICD-10-CM | POA: Diagnosis present

## 2016-05-02 DIAGNOSIS — K219 Gastro-esophageal reflux disease without esophagitis: Secondary | ICD-10-CM | POA: Diagnosis present

## 2016-05-02 DIAGNOSIS — E669 Obesity, unspecified: Secondary | ICD-10-CM | POA: Diagnosis present

## 2016-05-02 DIAGNOSIS — Z3A36 36 weeks gestation of pregnancy: Secondary | ICD-10-CM

## 2016-05-02 DIAGNOSIS — O34211 Maternal care for low transverse scar from previous cesarean delivery: Secondary | ICD-10-CM | POA: Diagnosis present

## 2016-05-02 DIAGNOSIS — Z6834 Body mass index (BMI) 34.0-34.9, adult: Secondary | ICD-10-CM | POA: Diagnosis not present

## 2016-05-02 DIAGNOSIS — L259 Unspecified contact dermatitis, unspecified cause: Secondary | ICD-10-CM | POA: Diagnosis present

## 2016-05-02 DIAGNOSIS — O42013 Preterm premature rupture of membranes, onset of labor within 24 hours of rupture, third trimester: Principal | ICD-10-CM | POA: Diagnosis present

## 2016-05-02 DIAGNOSIS — O30043 Twin pregnancy, dichorionic/diamniotic, third trimester: Secondary | ICD-10-CM | POA: Diagnosis present

## 2016-05-02 DIAGNOSIS — O9962 Diseases of the digestive system complicating childbirth: Secondary | ICD-10-CM | POA: Diagnosis present

## 2016-05-02 DIAGNOSIS — F418 Other specified anxiety disorders: Secondary | ICD-10-CM | POA: Diagnosis present

## 2016-05-02 DIAGNOSIS — F319 Bipolar disorder, unspecified: Secondary | ICD-10-CM | POA: Diagnosis present

## 2016-05-02 DIAGNOSIS — O4290 Premature rupture of membranes, unspecified as to length of time between rupture and onset of labor, unspecified weeks of gestation: Secondary | ICD-10-CM | POA: Diagnosis present

## 2016-05-02 DIAGNOSIS — Z87891 Personal history of nicotine dependence: Secondary | ICD-10-CM | POA: Diagnosis not present

## 2016-05-02 DIAGNOSIS — O34219 Maternal care for unspecified type scar from previous cesarean delivery: Secondary | ICD-10-CM | POA: Diagnosis present

## 2016-05-02 DIAGNOSIS — O1404 Mild to moderate pre-eclampsia, complicating childbirth: Secondary | ICD-10-CM | POA: Diagnosis present

## 2016-05-02 HISTORY — DX: Premature rupture of membranes, unspecified as to length of time between rupture and onset of labor, unspecified weeks of gestation: O42.90

## 2016-05-02 HISTORY — DX: Twin pregnancy, dichorionic/diamniotic, third trimester: O30.043

## 2016-05-02 LAB — CBC
HEMATOCRIT: 32 % — AB (ref 36.0–46.0)
Hemoglobin: 10.7 g/dL — ABNORMAL LOW (ref 12.0–15.0)
MCH: 30.3 pg (ref 26.0–34.0)
MCHC: 33.4 g/dL (ref 30.0–36.0)
MCV: 90.7 fL (ref 78.0–100.0)
Platelets: 210 10*3/uL (ref 150–400)
RBC: 3.53 MIL/uL — AB (ref 3.87–5.11)
RDW: 14.6 % (ref 11.5–15.5)
WBC: 7.7 10*3/uL (ref 4.0–10.5)

## 2016-05-02 LAB — URIC ACID: Uric Acid, Serum: 7.6 mg/dL — ABNORMAL HIGH (ref 2.3–6.6)

## 2016-05-02 LAB — COMPREHENSIVE METABOLIC PANEL
ALBUMIN: 2.5 g/dL — AB (ref 3.5–5.0)
ALK PHOS: 284 U/L — AB (ref 38–126)
ALT: 38 U/L (ref 14–54)
ANION GAP: 8 (ref 5–15)
AST: 52 U/L — AB (ref 15–41)
BILIRUBIN TOTAL: 0.3 mg/dL (ref 0.3–1.2)
BUN: 12 mg/dL (ref 6–20)
CO2: 22 mmol/L (ref 22–32)
Calcium: 8.7 mg/dL — ABNORMAL LOW (ref 8.9–10.3)
Chloride: 103 mmol/L (ref 101–111)
Creatinine, Ser: 0.75 mg/dL (ref 0.44–1.00)
GFR calc Af Amer: >60 mL/min (ref >60)
GFR calc non Af Amer: >60 mL/min (ref >60)
GLUCOSE: 73 mg/dL (ref 65–99)
POTASSIUM: 4.2 mmol/L (ref 3.5–5.1)
SODIUM: 133 mmol/L — AB (ref 135–145)
TOTAL PROTEIN: 6.2 g/dL — AB (ref 6.5–8.1)

## 2016-05-02 LAB — PROTEIN / CREATININE RATIO, URINE
Creatinine, Urine: 67 mg/dL
PROTEIN CREATININE RATIO: 0.88 mg/mg{creat} — AB (ref 0.00–0.15)
TOTAL PROTEIN, URINE: 59 mg/dL

## 2016-05-02 LAB — RPR: RPR Ser Ql: NONREACTIVE

## 2016-05-02 LAB — MAGNESIUM: Magnesium: 3.8 mg/dL — ABNORMAL HIGH (ref 1.7–2.4)

## 2016-05-02 LAB — POCT FERN TEST: POCT Fern Test: POSITIVE

## 2016-05-02 SURGERY — Surgical Case
Anesthesia: Spinal | Site: Abdomen | Wound class: Clean Contaminated

## 2016-05-02 MED ORDER — ACETAMINOPHEN 500 MG PO TABS
ORAL_TABLET | ORAL | Status: AC
  Filled 2016-05-02: qty 2

## 2016-05-02 MED ORDER — OXYTOCIN 10 UNIT/ML IJ SOLN
INTRAMUSCULAR | Status: AC
  Filled 2016-05-02: qty 4

## 2016-05-02 MED ORDER — WITCH HAZEL-GLYCERIN EX PADS: 1.0000 "application " | MEDICATED_PAD | CUTANEOUS | Status: DC | PRN

## 2016-05-02 MED ORDER — BUPROPION HCL 100 MG PO TABS
200.0000 mg | ORAL_TABLET | Freq: Every day | ORAL | Status: DC
  Administered 2016-05-02 – 2016-05-05 (×4): 200 mg via ORAL
  Filled 2016-05-02 (×6): qty 2

## 2016-05-02 MED ORDER — LACTATED RINGERS IV SOLN
2.0000 g/h | INTRAVENOUS | Status: DC
  Administered 2016-05-02 – 2016-05-03 (×2): 2 g/h via INTRAVENOUS
  Filled 2016-05-02 (×2): qty 80

## 2016-05-02 MED ORDER — CEFAZOLIN SODIUM-DEXTROSE 2-4 GM/100ML-% IV SOLN
2.0000 g | INTRAVENOUS | Status: AC
  Administered 2016-05-02: 2 g via INTRAVENOUS
  Filled 2016-05-02: qty 100

## 2016-05-02 MED ORDER — BUPROPION HCL 100 MG PO TABS: 200.0000 mg | ORAL_TABLET | Freq: Two times a day (BID) | ORAL | Status: DC

## 2016-05-02 MED ORDER — ONDANSETRON HCL 4 MG/2ML IJ SOLN
INTRAMUSCULAR | Status: AC
  Filled 2016-05-02: qty 2

## 2016-05-02 MED ORDER — FENTANYL CITRATE (PF) 100 MCG/2ML IJ SOLN
INTRAMUSCULAR | Status: AC
  Filled 2016-05-02: qty 2

## 2016-05-02 MED ORDER — MEPERIDINE HCL 25 MG/ML IJ SOLN: 6.2500 mg | INTRAMUSCULAR | Status: DC | PRN

## 2016-05-02 MED ORDER — OXYCODONE-ACETAMINOPHEN 5-325 MG PO TABS
1.0000 | ORAL_TABLET | ORAL | Status: DC | PRN
  Administered 2016-05-02 (×2): 1 via ORAL
  Filled 2016-05-02 (×2): qty 1

## 2016-05-02 MED ORDER — IBUPROFEN 600 MG PO TABS
600.0000 mg | ORAL_TABLET | Freq: Four times a day (QID) | ORAL | Status: DC
  Administered 2016-05-03 – 2016-05-04 (×5): 600 mg via ORAL
  Filled 2016-05-02 (×5): qty 1

## 2016-05-02 MED ORDER — BUPIVACAINE HCL (PF) 0.25 % IJ SOLN
INTRAMUSCULAR | Status: DC | PRN
  Administered 2016-05-02: 8 mL

## 2016-05-02 MED ORDER — SIMETHICONE 80 MG PO CHEW
80.0000 mg | CHEWABLE_TABLET | ORAL | Status: DC | PRN
  Administered 2016-05-03 – 2016-05-05 (×2): 80 mg via ORAL
  Filled 2016-05-02: qty 1

## 2016-05-02 MED ORDER — OXYCODONE-ACETAMINOPHEN 5-325 MG PO TABS
2.0000 | ORAL_TABLET | ORAL | Status: DC | PRN
  Administered 2016-05-03 (×3): 2 via ORAL
  Filled 2016-05-02 (×3): qty 2

## 2016-05-02 MED ORDER — KETOROLAC TROMETHAMINE 30 MG/ML IJ SOLN: 30.0000 mg | Freq: Four times a day (QID) | INTRAMUSCULAR | Status: AC | PRN

## 2016-05-02 MED ORDER — LACTATED RINGERS IV SOLN
INTRAVENOUS | Status: DC | PRN
  Administered 2016-05-02: 07:00:00 via INTRAVENOUS

## 2016-05-02 MED ORDER — SODIUM CHLORIDE 0.9% FLUSH: 3.0000 mL | INTRAVENOUS | Status: DC | PRN

## 2016-05-02 MED ORDER — DIPHENHYDRAMINE HCL 25 MG PO CAPS
25.0000 mg | ORAL_CAPSULE | ORAL | Status: DC | PRN
  Filled 2016-05-02: qty 1

## 2016-05-02 MED ORDER — NALBUPHINE HCL 10 MG/ML IJ SOLN: 5.0000 mg | INTRAMUSCULAR | Status: DC | PRN

## 2016-05-02 MED ORDER — COCONUT OIL OIL: 1.0000 "application " | TOPICAL_OIL | Status: DC | PRN

## 2016-05-02 MED ORDER — SOD CITRATE-CITRIC ACID 500-334 MG/5ML PO SOLN
30.0000 mL | Freq: Once | ORAL | Status: AC
  Administered 2016-05-02: 30 mL via ORAL
  Filled 2016-05-02: qty 15

## 2016-05-02 MED ORDER — NALBUPHINE HCL 10 MG/ML IJ SOLN: 5.0000 mg | Freq: Once | INTRAMUSCULAR | Status: DC | PRN

## 2016-05-02 MED ORDER — ACETAMINOPHEN 500 MG PO TABS
1000.0000 mg | ORAL_TABLET | Freq: Four times a day (QID) | ORAL | Status: AC
  Administered 2016-05-02 (×2): 1000 mg via ORAL
  Filled 2016-05-02: qty 2

## 2016-05-02 MED ORDER — METOCLOPRAMIDE HCL 5 MG/ML IJ SOLN
INTRAMUSCULAR | Status: AC
  Filled 2016-05-02: qty 2

## 2016-05-02 MED ORDER — MORPHINE SULFATE (PF) 0.5 MG/ML IJ SOLN
INTRAMUSCULAR | Status: AC
  Filled 2016-05-02: qty 10

## 2016-05-02 MED ORDER — IBUPROFEN 600 MG PO TABS
600.0000 mg | ORAL_TABLET | Freq: Four times a day (QID) | ORAL | Status: DC | PRN
  Administered 2016-05-03: 600 mg via ORAL
  Filled 2016-05-02: qty 1

## 2016-05-02 MED ORDER — MENTHOL 3 MG MT LOZG: 1.0000 | LOZENGE | OROMUCOSAL | Status: DC | PRN

## 2016-05-02 MED ORDER — PHENYLEPHRINE 8 MG IN D5W 100 ML (0.08MG/ML) PREMIX OPTIME
INJECTION | INTRAVENOUS | Status: AC
  Filled 2016-05-02: qty 100

## 2016-05-02 MED ORDER — SCOPOLAMINE 1 MG/3DAYS TD PT72: 1.0000 | MEDICATED_PATCH | TRANSDERMAL | Status: DC

## 2016-05-02 MED ORDER — ALBUTEROL SULFATE (2.5 MG/3ML) 0.083% IN NEBU: 3.0000 mL | INHALATION_SOLUTION | Freq: Four times a day (QID) | RESPIRATORY_TRACT | Status: DC | PRN

## 2016-05-02 MED ORDER — PHENYLEPHRINE 8 MG IN D5W 100 ML (0.08MG/ML) PREMIX OPTIME
INJECTION | INTRAVENOUS | Status: DC | PRN
  Administered 2016-05-02: 40 ug/min via INTRAVENOUS

## 2016-05-02 MED ORDER — LACTATED RINGERS IV SOLN
INTRAVENOUS | Status: DC
  Administered 2016-05-02 – 2016-05-03 (×2): via INTRAVENOUS

## 2016-05-02 MED ORDER — SIMETHICONE 80 MG PO CHEW
80.0000 mg | CHEWABLE_TABLET | ORAL | Status: DC
  Administered 2016-05-03: 80 mg via ORAL
  Filled 2016-05-02 (×3): qty 1

## 2016-05-02 MED ORDER — ONDANSETRON HCL 4 MG/2ML IJ SOLN: 4.0000 mg | Freq: Three times a day (TID) | INTRAMUSCULAR | Status: DC | PRN

## 2016-05-02 MED ORDER — BUPROPION HCL 100 MG PO TABS
300.0000 mg | ORAL_TABLET | Freq: Every day | ORAL | Status: DC
  Administered 2016-05-02 – 2016-05-04 (×3): 300 mg via ORAL
  Filled 2016-05-02 (×5): qty 3

## 2016-05-02 MED ORDER — SODIUM CHLORIDE 0.9 % IR SOLN
Status: DC | PRN
  Administered 2016-05-02: 1000 mL

## 2016-05-02 MED ORDER — QUETIAPINE FUMARATE ER 300 MG PO TB24
600.0000 mg | ORAL_TABLET | Freq: Every day | ORAL | Status: DC
  Administered 2016-05-02 – 2016-05-04 (×3): 600 mg via ORAL
  Filled 2016-05-02 (×4): qty 2

## 2016-05-02 MED ORDER — SENNOSIDES-DOCUSATE SODIUM 8.6-50 MG PO TABS
2.0000 | ORAL_TABLET | ORAL | Status: DC
Start: 2016-05-03 — End: 2016-05-05
  Administered 2016-05-03 (×2): 2 via ORAL
  Filled 2016-05-02 (×2): qty 2

## 2016-05-02 MED ORDER — VALACYCLOVIR HCL 500 MG PO TABS
500.0000 mg | ORAL_TABLET | Freq: Every day | ORAL | Status: DC
  Administered 2016-05-02 – 2016-05-05 (×4): 500 mg via ORAL
  Filled 2016-05-02 (×5): qty 1

## 2016-05-02 MED ORDER — METOCLOPRAMIDE HCL 5 MG/ML IJ SOLN
INTRAMUSCULAR | Status: DC | PRN
  Administered 2016-05-02: 10 mg via INTRAVENOUS

## 2016-05-02 MED ORDER — DIBUCAINE 1 % RE OINT: 1.0000 "application " | TOPICAL_OINTMENT | RECTAL | Status: DC | PRN

## 2016-05-02 MED ORDER — KETOROLAC TROMETHAMINE 30 MG/ML IJ SOLN
INTRAMUSCULAR | Status: AC
  Filled 2016-05-02: qty 1

## 2016-05-02 MED ORDER — ZOLPIDEM TARTRATE 5 MG PO TABS: 5.0000 mg | ORAL_TABLET | Freq: Every evening | ORAL | Status: DC | PRN

## 2016-05-02 MED ORDER — FENTANYL CITRATE (PF) 100 MCG/2ML IJ SOLN
25.0000 ug | INTRAMUSCULAR | Status: DC | PRN
  Administered 2016-05-02 (×3): 50 ug via INTRAVENOUS

## 2016-05-02 MED ORDER — PRENATAL MULTIVITAMIN CH
1.0000 | ORAL_TABLET | Freq: Every day | ORAL | Status: DC
  Administered 2016-05-02 – 2016-05-04 (×3): 1 via ORAL
  Filled 2016-05-02 (×3): qty 1

## 2016-05-02 MED ORDER — OXYTOCIN 10 UNIT/ML IJ SOLN
INTRAVENOUS | Status: DC | PRN
  Administered 2016-05-02: 40 [IU] via INTRAVENOUS

## 2016-05-02 MED ORDER — KETOROLAC TROMETHAMINE 30 MG/ML IJ SOLN
30.0000 mg | Freq: Four times a day (QID) | INTRAMUSCULAR | Status: AC | PRN
  Administered 2016-05-02 (×2): 30 mg via INTRAVENOUS
  Filled 2016-05-02: qty 1

## 2016-05-02 MED ORDER — SCOPOLAMINE 1 MG/3DAYS TD PT72
MEDICATED_PATCH | TRANSDERMAL | Status: AC
  Filled 2016-05-02: qty 1

## 2016-05-02 MED ORDER — BUPIVACAINE IN DEXTROSE 0.75-8.25 % IT SOLN
INTRATHECAL | Status: DC | PRN
  Administered 2016-05-02: 1.2 mL via INTRATHECAL

## 2016-05-02 MED ORDER — NALOXONE HCL 2 MG/2ML IJ SOSY
1.0000 ug/kg/h | PREFILLED_SYRINGE | INTRAMUSCULAR | Status: DC | PRN
  Filled 2016-05-02: qty 2

## 2016-05-02 MED ORDER — MAGNESIUM SULFATE BOLUS VIA INFUSION
4.0000 g | Freq: Once | INTRAVENOUS | Status: AC
  Administered 2016-05-02: 4 g via INTRAVENOUS
  Filled 2016-05-02: qty 500

## 2016-05-02 MED ORDER — OXYTOCIN 40 UNITS IN LACTATED RINGERS INFUSION - SIMPLE MED: 2.5000 [IU]/h | INTRAVENOUS | Status: AC

## 2016-05-02 MED ORDER — ONDANSETRON HCL 4 MG/2ML IJ SOLN
INTRAMUSCULAR | Status: DC | PRN
  Administered 2016-05-02: 4 mg via INTRAVENOUS

## 2016-05-02 MED ORDER — FENTANYL CITRATE (PF) 100 MCG/2ML IJ SOLN
INTRAMUSCULAR | Status: DC | PRN
  Administered 2016-05-02: 20 ug via INTRATHECAL

## 2016-05-02 MED ORDER — FAMOTIDINE IN NACL 20-0.9 MG/50ML-% IV SOLN: 20.0000 mg | Freq: Once | INTRAVENOUS | Status: DC

## 2016-05-02 MED ORDER — LACTATED RINGERS IV SOLN
INTRAVENOUS | Status: DC
  Administered 2016-05-02 (×3): via INTRAVENOUS

## 2016-05-02 MED ORDER — NALOXONE HCL 0.4 MG/ML IJ SOLN: 0.4000 mg | INTRAMUSCULAR | Status: DC | PRN

## 2016-05-02 MED ORDER — DIPHENHYDRAMINE HCL 50 MG/ML IJ SOLN: 12.5000 mg | INTRAMUSCULAR | Status: DC | PRN

## 2016-05-02 MED ORDER — DIPHENHYDRAMINE HCL 25 MG PO CAPS: 25.0000 mg | ORAL_CAPSULE | Freq: Four times a day (QID) | ORAL | Status: DC | PRN

## 2016-05-02 MED ORDER — MORPHINE SULFATE (PF) 0.5 MG/ML IJ SOLN
INTRAMUSCULAR | Status: DC | PRN
  Administered 2016-05-02: .2 mg via EPIDURAL

## 2016-05-02 MED ORDER — SIMETHICONE 80 MG PO CHEW
80.0000 mg | CHEWABLE_TABLET | Freq: Three times a day (TID) | ORAL | Status: DC
  Administered 2016-05-02 – 2016-05-05 (×9): 80 mg via ORAL
  Filled 2016-05-02 (×8): qty 1

## 2016-05-02 SURGICAL SUPPLY — 32 items
APL SKNCLS STERI-STRIP NONHPOA (GAUZE/BANDAGES/DRESSINGS) ×2
BARRIER ADHS 3X4 INTERCEED (GAUZE/BANDAGES/DRESSINGS) ×4 IMPLANT
BENZOIN TINCTURE PRP APPL 2/3 (GAUZE/BANDAGES/DRESSINGS) ×3 IMPLANT
BRR ADH 4X3 ABS CNTRL BYND (GAUZE/BANDAGES/DRESSINGS) ×2
CHLORAPREP W/TINT 26ML (MISCELLANEOUS) ×4 IMPLANT
CLAMP CORD UMBIL (MISCELLANEOUS) ×3 IMPLANT
CLOSURE WOUND 1/2 X4 (GAUZE/BANDAGES/DRESSINGS) ×1
CLOTH BEACON ORANGE TIMEOUT ST (SAFETY) ×4 IMPLANT
DRAPE C SECTION CLR SCREEN (DRAPES) ×4 IMPLANT
DRSG OPSITE POSTOP 4X10 (GAUZE/BANDAGES/DRESSINGS) ×4 IMPLANT
ELECT REM PT RETURN 9FT ADLT (ELECTROSURGICAL) ×4
ELECTRODE REM PT RTRN 9FT ADLT (ELECTROSURGICAL) ×2 IMPLANT
GLOVE BIOGEL PI IND STRL 7.0 (GLOVE) ×4 IMPLANT
GLOVE BIOGEL PI INDICATOR 7.0 (GLOVE) ×4
GLOVE ECLIPSE 6.5 STRL STRAW (GLOVE) ×4 IMPLANT
GOWN STRL REUS W/TWL LRG LVL3 (GOWN DISPOSABLE) ×8 IMPLANT
NEEDLE HYPO 22GX1.5 SAFETY (NEEDLE) ×4 IMPLANT
NS IRRIG 1000ML POUR BTL (IV SOLUTION) ×4 IMPLANT
PACK C SECTION WH (CUSTOM PROCEDURE TRAY) ×4 IMPLANT
PAD OB MATERNITY 4.3X12.25 (PERSONAL CARE ITEMS) ×4 IMPLANT
RTRCTR C-SECT PINK 25CM LRG (MISCELLANEOUS) ×3 IMPLANT
STRIP CLOSURE SKIN 1/2X4 (GAUZE/BANDAGES/DRESSINGS) ×2 IMPLANT
SUT MNCRL 0 VIOLET CTX 36 (SUTURE) ×6 IMPLANT
SUT MONOCRYL 0 CTX 36 (SUTURE) ×6
SUT PLAIN 2 0 XLH (SUTURE) ×3 IMPLANT
SUT VIC AB 0 CT1 36 (SUTURE) ×8 IMPLANT
SUT VIC AB 2-0 CT1 27 (SUTURE) ×4
SUT VIC AB 2-0 CT1 TAPERPNT 27 (SUTURE) ×2 IMPLANT
SUT VIC AB 4-0 KS 27 (SUTURE) ×3 IMPLANT
SYR CONTROL 10ML LL (SYRINGE) ×4 IMPLANT
TOWEL OR 17X24 6PK STRL BLUE (TOWEL DISPOSABLE) ×4 IMPLANT
TRAY FOLEY CATH SILVER 14FR (SET/KITS/TRAYS/PACK) ×3 IMPLANT

## 2016-05-02 NOTE — Op Note (Signed)
NAMMarijean Harris:  Napierala, Shenay           ACCOUNT NO.:  [id number]  MEDICAL RECORD NO.:  [id number]  LOCATION:                                 FACILITY:  PHYSICIAN:  Maxie BetterSheronette Jasdeep Dejarnett, M.D.DATE OF BIRTH:  [birth year]  DATE OF PROCEDURE:  05/02/2016 DATE OF DISCHARGE:                              OPERATIVE REPORT   PREOPERATIVE DIAGNOSES:  Premature rupture of membranes, twin gestation at 3936 and 5/7 weeks, previous cesarean section.  PROCEDURE:  Repeat cesarean section, Kerr hysterotomy.  POSTOPERATIVE DIAGNOSES:  Premature rupture of membranes, twin gestation at 5536 and 5/7 weeks, previous cesarean section.  ANESTHESIA:  Spinal.  SURGEON:  Maxie BetterSheronette Overton Boggus, MD.  ASSISTANT:  Denton Meekolita Dawson, CNM.  DESCRIPTION OF PROCEDURE:  Under adequate spinal anesthesia, the patient was placed in the supine position with a left lateral tilt.  She was sterilely prepped and draped in usual fashion.  An indwelling Foley catheter was sterilely placed.  A 0.25% Marcaine was injected along the previous Pfannenstiel skin incision site.  Pfannenstiel skin incision was then made, carried down to the rectus fascia.  The rectus fascia was opened transversely.  Rectus fascia was then bluntly and sharply dissected off the rectus muscle in a superior and inferior fashion.  The rectus muscle was split in the midline.  The parietal peritoneum was entered bluntly and extended.  A self-retaining Alexis retractor was then placed.  Some omental adhesion was on the lower uterine segment which was subsequently removed.  The vesicouterine peritoneum was opened transversely.  The bladder was bluntly dissected off the lower uterine segment, displaced inferiorly.  A curvilinear low transverse uterine incision was made from a thin lower uterine segment, extended with bandage scissors.  Artificial rupture of membranes, clear amniotic fluid was noted.  Subsequent delivery of a live female from the left occiput posterior  position with a nuchal cord x1, which was reducible was accomplished.  Baby was bulb suctioned in the abdomen.  Cord was clamped, cut.  The baby was transferred to the awaiting pediatrician with Apgars 8 and 9 at 1 and 5 minutes.  The second amniotic sac was artificially ruptured, clear fluid.  Subsequent delivery of a live female from the right occiput transverse position, who was also bulb suctioned in the abdomen.  Cord was clamped, cut.  The baby was transferred to the awaiting pediatrician.  Assigned Apgars 8 and 9 at 1 and 5 minutes.  The twin placenta which was posterior was removed manually.  Uterine cavity was cleaned of debris.  Uterine incision had no extension, was closed in 2 layers; the first layer with 0 Monocryl running lock stitch, second layer was imbricated using 0 Monocryl suture.  Normal tubes were noted bilaterally.  Both ovaries which appeared normal but otherwise adherent to the posterior aspect of the uterus.  Normal appendix was noted.  The abdomen was irrigated and suctioned of debris.  The Interceed was placed in the lower uterine segment in an inverted T fashion.  The Alexis retractor was removed.  The parietal peritoneum was then closed with 2-0 Vicryl.  The rectus fascia was closed with 0 Vicryl x2.  The subcutaneous area was irrigated, small bleeders cauterized, interrupted 2-0 plain sutures placed, and  the skin approximated with 4-0 Vicryl subcuticular closure.  SPECIMENS:  Placenta x2, sent to Pathology.  ESTIMATED BLOOD LOSS:  800 mL.  INTRAOPERATIVE FLUIDS:  2600 mL.  URINE OUTPUT:  100 mL clear yellow urine.  COUNTS:  Sponge and instrument counts x2 was correct.  COMPLICATION:  None.  The patient tolerated the procedure well, was transferred to recovery room in stable condition.  Babies times 2 both placed skin to skin.     Maxie Better, M.D.   ______________________________ Maxie Better, M.D.    [place]/[facility]  D:  05/02/2016   T:  05/02/2016  Job:  [id number]

## 2016-05-02 NOTE — Progress Notes (Signed)
Labs reviewed C/w  Mild preeclampsia Will start magnesium sulfate now

## 2016-05-02 NOTE — H&P (Signed)
Tracy Harris is a 36 y.o. female presenting @ 36 5/7 weeks twin( di-di) gestation with SROM clear fluid @ 3:15 am. (-) ctx. GBS cx neg. Prev C/S sched for repeat 3/19 OB History    Gravida Para Term Preterm AB Living   2 1 1     1    SAB TAB Ectopic Multiple Live Births           1     Past Medical History:  Diagnosis Date  . Anxiety   . Bipolar 1 disorder (HCC)   . Endometriosis   . GERD (gastroesophageal reflux disease)   . History of anorexia nervosa    hospitalized age 14 and 79  . History of kidney stones   . History of positive PPD   . Mild asthma   . PONV (postoperative nausea and vomiting)   . Wears contact lenses    Past Surgical History:  Procedure Laterality Date  . CESAREAN SECTION  02/11/2011   Procedure: CESAREAN SECTION;  Surgeon: Serita Kyle, MD;  Location: [facility];  Service: Gynecology;  Laterality: N/A;  . DX  LAPAROSCOPY/  BX LEFT OVARIAN CYST/  ABLATION AND EXCISION ENDOMETRIOSIS/ LYSIS OF ADHESIONS  03-21-2010   and chromopurtubation  . LAPAROSCOPY N/A 09/20/2013   Procedure: LAPAROSCOPY LYSIS OF ADHESIONS,  EXCISION OF ENDOMETRIOSIS RIGHT OVARIAN CYSTECTOMY.;  Surgeon: Fermin Schwab, MD;  Location: [facility];  Service: Gynecology;  Laterality: N/A;  . LASER ABLATION HPV , VAGINA, CERVIX, PERIANAL , AND VULVA  08-15-2002  . TONSILLECTOMY  2011   Family History: family history is not on file. Social History:  reports that she quit smoking about 12 years ago. Her smoking use included Cigarettes. She has a 10.00 pack-year smoking history. She has never used smokeless tobacco. She reports that she drinks alcohol. She reports that she does not use drugs.     Maternal Diabetes: No Genetic Screening: Normal Maternal Ultrasounds/Referrals: Normal Fetal Ultrasounds or other Referrals:  Fetal echo twins x 2 Maternal Substance Abuse:  No Significant Maternal Medications:  Meds include: Other: wellbutrin, seroquil,  ambien Significant Maternal Lab Results:  Lab values include: Group B Strep negative Other Comments:  IVF twins, depression/anxiety, insomnia  Review of Systems  Eyes: Negative for blurred vision.  Gastrointestinal: Negative for heartburn.  Musculoskeletal: Positive for back pain.  Neurological: Negative for headaches.  Psychiatric/Behavioral: Positive for depression.   History   Blood pressure 157/94, pulse 94, temperature 98.2 F (36.8 C), temperature source Oral, resp. rate 18, height 5' 2.5" (1.588 m), weight 86.6 kg (191 lb). Maternal Exam:  Introitus: Ferning test: positive.      Physical Exam  Constitutional: She is oriented to person, place, and time. She appears well-developed and well-nourished.  HENT:  Head: Atraumatic.  Eyes: EOM are normal.  Neck: Neck supple.  Cardiovascular: Regular rhythm.   GI: Soft.  Musculoskeletal: She exhibits edema.  Neurological: She is alert and oriented to person, place, and time. She has normal reflexes.  Skin: Skin is warm and dry.  Psychiatric: She has a normal mood and affect.    Prenatal labs: ABO, Rh: --/--/O POS (01/13 8119) Antibody: NEG (01/13 0721) Rubella:  Immune RPR:   NR HBsAg:   neg HIV:   NR GBS:   neg CBC    Component Value Date/Time   WBC 9.9 02/12/2011 0512   RBC 3.28 (L) 02/12/2011 0512   HGB 13.2 09/20/2013 1203   HCT 30.6 (L) 02/12/2011 1478  PLT 152 02/12/2011 0512   MCV 93.3 02/12/2011 0512   MCH 31.7 02/12/2011 0512   MCHC 34.0 02/12/2011 0512   RDW 13.1 02/12/2011 0512    Assessment/Plan: PROM Twin gestation @ 36 5/7 weeks Gest HTN P) repeat C/S. PIH labs. Risk of surgery reviewed including infection, bleeding, poss need for blood transfusion and its risk, internal scar tissue, Injury to surrounding organ structures( bowel, bladder, ureter). ALL ? answered  Becci Batty A 05/02/2016, 5:48 AM

## 2016-05-02 NOTE — Transfer of Care (Signed)
Immediate Anesthesia Transfer of Care Note  Patient: Tracy Harris  Procedure(s) Performed: Procedure(s): CESAREAN SECTION (N/A)  Patient Location: PACU  Anesthesia Type:Spinal  Level of Consciousness: awake and alert   Airway & Oxygen Therapy: Patient Spontanous Breathing and Patient connected to nasal cannula oxygen  Post-op Assessment: Report given to RN and Post -op Vital signs reviewed and stable  Post vital signs: Reviewed  Last Vitals:  Vitals:   05/02/16 0512 05/02/16 0558  BP: 157/94 153/87  Pulse: 94 100  Resp:  18  Temp:      Last Pain:  Vitals:   05/02/16 0431  TempSrc: Oral      Patients Stated Pain Goal: 6 (05/02/16 0430)  Complications: No apparent anesthesia complications

## 2016-05-02 NOTE — Anesthesia Preprocedure Evaluation (Addendum)
Anesthesia Evaluation  Patient identified by MRN, date of birth, ID band Patient awake    Reviewed: Allergy & Precautions, NPO status , Patient's Chart, lab work & pertinent test results  History of Anesthesia Complications (+) PONV and history of anesthetic complications  Airway Mallampati: II  TM Distance: >3 FB Neck ROM: Full    Dental no notable dental hx. (+) Teeth Intact   Pulmonary asthma , former smoker,  Hx/o  PPD +   Pulmonary exam normal breath sounds clear to auscultation       Cardiovascular Normal cardiovascular exam Rhythm:Regular Rate:Normal  Swelling and erythema left leg- Doppler negative this week   Neuro/Psych PSYCHIATRIC DISORDERS Anxiety Bipolar Disorder negative neurological ROS     GI/Hepatic Neg liver ROS, GERD  Medicated and Controlled,  Endo/Other  Obesity  Renal/GU negative Renal ROS  negative genitourinary   Musculoskeletal negative musculoskeletal ROS (+)   Abdominal (+) + obese,   Peds  Hematology  (+) anemia ,   Anesthesia Other Findings   Reproductive/Obstetrics HSV Previous C/Section Twin gestation  36 weeks SROM                            Anesthesia Physical Anesthesia Plan  ASA: II and emergent  Anesthesia Plan: Spinal   Post-op Pain Management:    Induction:   Airway Management Planned: Natural Airway  Additional Equipment:   Intra-op Plan:   Post-operative Plan:   Informed Consent: I have reviewed the patients History and Physical, chart, labs and discussed the procedure including the risks, benefits and alternatives for the proposed anesthesia with the patient or authorized representative who has indicated his/her understanding and acceptance.     Plan Discussed with: CRNA, Anesthesiologist and Surgeon  Anesthesia Plan Comments:         Anesthesia Quick Evaluation

## 2016-05-02 NOTE — Brief Op Note (Signed)
05/02/2016  7:39 AM  PATIENT:  Tracy Harris  36 y.o. female  PRE-OPERATIVE DIAGNOSIS:  previous cesarean section, PROM , twins@ 36 5/7 weeks  POST-OPERATIVE DIAGNOSIS:  previous cesarean section, PROM,  Twins@ 36 5/7 weeks  PROCEDURE:  Repeat Cesarean section, kerr hysterotomy  SURGEON:  Surgeon(s) and Role:    * Maxie BetterSheronette Daysi Boggan, MD - Primary  PHYSICIAN ASSISTANT:   ASSISTANTS: Raelyn Moraolitta Dawson, CNM   ANESTHESIA:   spinal FINDINGS: LOP live female CAN x 1, ROT live female infant, post placenta x 2, nl tubes, ovaries adherent to post wall Nl appendix  EBL:  Total I/O In: 1000 [I.V.:1000] Out: 100 [Urine:100]  BLOOD ADMINISTERED:none  DRAINS: none   LOCAL MEDICATIONS USED:  MARCAINE     SPECIMEN:  Source of Specimen:  placenta x 2  DISPOSITION OF SPECIMEN:  PATHOLOGY  COUNTS:  YES  TOURNIQUET:  * No tourniquets in log *  DICTATION: .Other Dictation: Dictation Number [id number]  PLAN OF CARE: Admit to inpatient   PATIENT DISPOSITION:  PACU - hemodynamically stable.   Delay start of Pharmacological VTE agent (>24hrs) due to surgical blood loss or risk of bleeding: not applicable

## 2016-05-02 NOTE — Op Note (Deleted)
  The note originally documented on this encounter has been moved the the encounter in which it belongs.  

## 2016-05-02 NOTE — Anesthesia Postprocedure Evaluation (Signed)
Anesthesia Post Note  Patient: Tracy Harris  Procedure(s) Performed: Procedure(s) (LRB): CESAREAN SECTION (N/A)  Patient location during evaluation: PACU Anesthesia Type: Spinal Level of consciousness: awake Pain management: pain level controlled Vital Signs Assessment: post-procedure vital signs reviewed and stable Respiratory status: spontaneous breathing Cardiovascular status: stable Postop Assessment: no headache, no backache, spinal receding, patient able to bend at knees and no signs of nausea or vomiting Anesthetic complications: no        Last Vitals:  Vitals:   05/02/16 0830 05/02/16 0846  BP: 128/87 134/88  Pulse: 87 85  Resp: 13 10  Temp:      Last Pain:  Vitals:   05/02/16 0845  TempSrc:   PainSc: 5    Pain Goal: Patients Stated Pain Goal: 6 (05/02/16 0430)               Akshaya Toepfer JR,JOHN Susann GivensFRANKLIN

## 2016-05-02 NOTE — Progress Notes (Signed)
INTERVAL NOTE:  C/S [redacted]w[redacted]d Di/Di twins, PPROM, pre-eclampsia w/o severe features. On Mag Sulfate Therapy  S: Resting in bed, feels tired but well, denies HA/NV/RUQ pain. [facility].   Mag Sulfate 2 gm/hr, SCD's on, foley to gravity.  Hot packs and Kpad to abdomen for comfort.   O:  Vitals:   05/02/16 0846 05/02/16 0900 05/02/16 0936 05/02/16 1035  BP: 134/88 129/84 134/79 134/65  Pulse: 85 84 84 85  Resp: '10 14 16 18  ' Temp:   97.4 F (36.3 C) 97.5 F (36.4 C)  TempSrc:   Oral Oral  SpO2: 97% 98% 98% 98%  Weight:      Height:        Intake/Output Summary (Last 24 hours) at 05/02/16 1134 Last data filed at 05/02/16 1039  Gross per 24 hour  Intake             2750 ml  Output             1725 ml  Net             1025 ml    CBC Latest Ref Rng & Units 05/02/2016 09/20/2013 02/12/2011  WBC 4.0 - 10.5 K/uL 7.7 - 9.9  Hemoglobin 12.0 - 15.0 g/dL 10.7(L) 13.2 10.4(L)  Hematocrit 36.0 - 46.0 % 32.0(L) - 30.6(L)  Platelets 150 - 400 K/uL 210 - 152    Hepatic Function Latest Ref Rng & Units 05/02/2016  Total Protein 6.5 - 8.1 g/dL 6.2(L)  Albumin 3.5 - 5.0 g/dL 2.5(L)  AST 15 - 41 U/L 52(H)  ALT 14 - 54 U/L 38  Alk Phosphatase 38 - 126 U/L 284(H)  Total Bilirubin 0.3 - 1.2 mg/dL 0.3    Exam:  AAO x 3, NAD Fundus firm U +2 Incision: C/D/I, no discharge Edema +2 pedal Lochia moderate, no clots  A/P: [id number]/p C/S twins DiDi, 6 hrs post-op - stable post op, bottlefeeding twins  Preeclampmsia w/o severe features -normotensive, no neural s/s - continue Mag Sulfate therapy x 24 hrs - monitor closely I&O - rpt PEC labs in AM   Ariya Bohannon C Simcha Farrington,CNM 05/02/2016, 11:39 AM

## 2016-05-02 NOTE — MAU Note (Signed)
Water broke at 0315, clear. No bleeding. Babies moving well. Closed at last exam. Scheduled C/S.

## 2016-05-02 NOTE — Addendum Note (Signed)
Addendum  created 05/02/16 1531 by Renford DillsJanet L Iran Kievit, CRNA   Sign clinical note

## 2016-05-02 NOTE — Anesthesia Postprocedure Evaluation (Addendum)
Anesthesia Post Note  Patient: Tracy Harris  Procedure(s) Performed: Procedure(s) (LRB): CESAREAN SECTION (N/A)  Patient location during evaluation: [facility] Anesthesia Type: Spinal Level of consciousness: awake Pain management: pain level controlled Vital Signs Assessment: post-procedure vital signs reviewed and stable Respiratory status: spontaneous breathing Cardiovascular status: stable Postop Assessment: no headache, no backache, spinal receding, patient able to bend at knees, no signs of nausea or vomiting and adequate PO intake Anesthetic complications: no        Last Vitals:  Vitals:   05/02/16 1346 05/02/16 1429  BP: (!) 144/84 129/79  Pulse: 88 93  Resp: 16 18  Temp: 36.4 C 36.8 C    Last Pain:  Vitals:   05/02/16 1429  TempSrc: Oral  PainSc:    Pain Goal: Patients Stated Pain Goal: 5 (05/02/16 [id number])               Fanny DanceMULLINS,JANET

## 2016-05-03 ENCOUNTER — Encounter (HOSPITAL_COMMUNITY): Payer: Self-pay

## 2016-05-03 LAB — CBC
HCT: 27.2 % — ABNORMAL LOW (ref 36.0–46.0)
Hemoglobin: 9.2 g/dL — ABNORMAL LOW (ref 12.0–15.0)
MCH: 30.7 pg (ref 26.0–34.0)
MCHC: 33.8 g/dL (ref 30.0–36.0)
MCV: 90.7 fL (ref 78.0–100.0)
PLATELETS: 206 10*3/uL (ref 150–400)
RBC: 3 MIL/uL — ABNORMAL LOW (ref 3.87–5.11)
RDW: 14.6 % (ref 11.5–15.5)
WBC: 10.2 10*3/uL (ref 4.0–10.5)

## 2016-05-03 MED ORDER — LABETALOL HCL 100 MG PO TABS
100.0000 mg | ORAL_TABLET | Freq: Two times a day (BID) | ORAL | Status: DC
  Administered 2016-05-03 – 2016-05-05 (×5): 100 mg via ORAL
  Filled 2016-05-03 (×5): qty 1

## 2016-05-03 MED ORDER — OXYCODONE HCL 5 MG PO TABS
5.0000 mg | ORAL_TABLET | Freq: Four times a day (QID) | ORAL | Status: DC | PRN
  Administered 2016-05-03 – 2016-05-04 (×3): 10 mg via ORAL
  Administered 2016-05-04: 5 mg via ORAL
  Administered 2016-05-05: 10 mg via ORAL
  Filled 2016-05-03: qty 1
  Filled 2016-05-03 (×4): qty 2

## 2016-05-03 NOTE — Progress Notes (Addendum)
Subjective: S/P C/S - twins DiDi, mild PEC on Mag Sulfate, POD# 1   Information for the patient's newborn:  Forbis, GirlA Brieann [[id number]]  female Information for the patient's newborn:  Lobato, BoyB Aarti [[id number]]  female   circ planned Baby name: A: Katie   B: Max  Reports feeling well, has been up and ambulating hallway with walker. Feeding: bottle Patient reports tolerating PO.  Breast symptoms: none Pain controlled with PO meds Denies HA/SOB/C/P/N/V/dizziness. Flatus absent. She reports vaginal bleeding as normal, without clots.  She is ambulating, foley cath in place.       Objective:   VS:    Vitals:   05/03/16 0210 05/03/16 0300 05/03/16 0357 05/03/16 0858  BP:   (!) 147/94 (!) 148/86  Pulse:   92 86  Resp: 17 18 18 18  Temp:   98.1 F (36.7 C) 98 F (36.7 C)  TempSrc:   Oral Oral  SpO2: 98% 98% 99% 100%  Weight:      Height:         Intake/Output Summary (Last 24 hours) at 05/03/16 1102 Last data filed at 05/03/16 1047  Gross per 24 hour  Intake          5049.75 ml  Output             7875 ml  Net         -2825.25 ml        Recent Labs  05/02/16 0535 05/03/16 0605  WBC 7.7 10.2  HGB 10.7* 9.2*  HCT 32.0* 27.2*  PLT 210 206     Blood type: --/--/O POS (01/13 0721)  Rubella:   equivocal    Physical Exam:  General: alert, cooperative and no distress CV: Regular rate and rhythm Resp: clear Abdomen: gas distention, hypotonic BS Incision: clean, dry and intact Uterine Fundus: firm, below umbilicus, appropriate tenderness Lochia: minimal Ext: edema +1, redness and swelling improved in L leg      Assessment/Plan: 36 y.o.   POD# 1. G2P1102                  Principal Problem:   Postpartum care following cesarean delivery Indication: PROM / Di-Di Twins / Repeat (3/10) Active Problems:   Premature rupture of membranes   Dichorionic diamniotic twin pregnancy in third trimester   Previous cesarean delivery affecting pregnancy  Pre-eclampsia w/o severe features  - Mag Sulfate x 24 hrs, will DC now.   - BP mild elevation, good diuresis, plts stable  - Labetalol 100 mg PO BID Hx Bipolar, stable on meds.   Doing well, stable.               Advance diet as tolerated Encourage rest when baby rests Warm fluids to increase gut motility Encourage to ambulate MMR prior to DC Desires DC home tomorrow  Routine post-op care  Consult: Dr. Cousins   C , CNM, MSN 05/03/2016, 11:02 AM   

## 2016-05-04 ENCOUNTER — Encounter (HOSPITAL_COMMUNITY): Payer: Self-pay | Admitting: *Deleted

## 2016-05-04 MED ORDER — IBUPROFEN 800 MG PO TABS
800.0000 mg | ORAL_TABLET | Freq: Three times a day (TID) | ORAL | Status: DC
  Administered 2016-05-04 – 2016-05-05 (×3): 800 mg via ORAL
  Filled 2016-05-04 (×4): qty 1

## 2016-05-04 MED ORDER — MAGNESIUM OXIDE 400 (241.3 MG) MG PO TABS
400.0000 mg | ORAL_TABLET | Freq: Every day | ORAL | Status: DC
  Administered 2016-05-04 – 2016-05-05 (×2): 400 mg via ORAL
  Filled 2016-05-04 (×3): qty 1

## 2016-05-04 MED ORDER — HYDROCORTISONE 1 % EX CREA
TOPICAL_CREAM | Freq: Three times a day (TID) | CUTANEOUS | Status: DC
  Administered 2016-05-04 – 2016-05-05 (×4): via TOPICAL
  Filled 2016-05-04: qty 28

## 2016-05-04 MED ORDER — POLYSACCHARIDE IRON COMPLEX 150 MG PO CAPS
150.0000 mg | ORAL_CAPSULE | Freq: Every day | ORAL | Status: DC
  Administered 2016-05-04 – 2016-05-05 (×2): 150 mg via ORAL
  Filled 2016-05-04 (×3): qty 1

## 2016-05-04 NOTE — Plan of Care (Signed)
Problem: Nutritional: Goal: Mothers verbalization of comfort with breastfeeding process will improve Outcome: Not Applicable Date Met: 11/88/67 Bottlefeeding

## 2016-05-04 NOTE — Progress Notes (Addendum)
POSTOPERATIVE DAY # 2 S/P CS - twins with mild PEC  S:         Reports feeling sore today / does not want to go home / no PIH symptoms             Tolerating po intake / no nausea / no vomiting / + flatus / no BM             Bleeding is light             Pain minimally controlled with motrin 600 and switched to Oxy IR early AM             Up ad lib / not ambulatory this am/ voiding QS  Newborns Katie and Max /  formula feeding   O:  VS: BP 119/64 (BP Location: Left Arm)   Pulse 81   Temp 98.6 F (37 C) (Oral)   Resp 18   Ht 5' 2.5" (1.588 m)   Wt 86.6 kg (191 lb)   SpO2 98%   Breastfeeding? Unknown   BMI 34.38 kg/m   BP 13/92 - 122/77 - 122/68   LABS:               Recent Labs  05/02/16 0535 05/03/16 0605  WBC 7.7 10.2  HGB 10.7* 9.2*  PLT 210 206               Bloodtype: --/--/O POS (01/13 0721)  Rubella:   non-immune - offer MMR booster prior to DC                                       I&O: net negative 3220             Physical Exam:             Alert and Oriented X3  Lungs: Clear and unlabored  Heart: regular rate and rhythm / no mumurs  Abdomen: soft, non-tender, mildly distended distended, hypoactive                              Small eurythermal macular rash in shape of abdominal dressing             Fundus: firm, non-tender, Ueven             Dressing intact honeycomb              Incision:  approximated with suture / no erythema / no ecchymosis / no drainage  Perineum: intact  Lochia: light  Extremities: L>R edema, no calf pain or tenderness,  Negative Homans  A:        POD # 2 S/P CS twins            Mild PEC with stable BP and good diuresis / asymptomatic            ABL anemia            Mild contact dermatitis  P:        Routine postoperative care              Adjusted analgesia - encouraged binder and ambulation             Start magnesium and iron today             Anticipate DC tomorrow  Add hydrocortisone 1% to rash TID  prn   Artelia Laroche CNM, MSN, [facility] 05/04/2016, 8:55 AM

## 2016-05-05 MED ORDER — HYDROCORTISONE 1 % EX CREA: TOPICAL_CREAM | Freq: Three times a day (TID) | CUTANEOUS | 0 refills | Status: DC

## 2016-05-05 MED ORDER — OXYCODONE HCL 5 MG PO TABS: 5.0000 mg | ORAL_TABLET | ORAL | 0 refills | Status: DC | PRN

## 2016-05-05 MED ORDER — IBUPROFEN 800 MG PO TABS: 800.0000 mg | ORAL_TABLET | Freq: Three times a day (TID) | ORAL | 0 refills | Status: DC

## 2016-05-05 MED ORDER — POLYSACCHARIDE IRON COMPLEX 150 MG PO CAPS: 150.0000 mg | ORAL_CAPSULE | Freq: Every day | ORAL | 3 refills | Status: DC

## 2016-05-05 MED ORDER — MAGNESIUM OXIDE 400 (241.3 MG) MG PO TABS: 400.0000 mg | ORAL_TABLET | Freq: Every day | ORAL | Status: DC

## 2016-05-05 MED ORDER — LABETALOL HCL 100 MG PO TABS: 100.0000 mg | ORAL_TABLET | Freq: Two times a day (BID) | ORAL | 1 refills | Status: AC

## 2016-05-05 NOTE — Discharge Summary (Signed)
OB Discharge Summary     Patient Name: Tracy Harris DOB: May 19, 1980 MRN: [id number]  Date of admission: 05/02/2016 Delivering MD:    Naviah, Belfield [[id number]]  Leaira Fullam, Trine, Fread [[id number]]  Cabella Kimm   Date of discharge: 05/05/2016  Admitting diagnosis: 52w5dTwin gestation, PROM, previous section Intrauterine pregnancy: 384w5d   Secondary diagnosis:  Principal Problem:   Postpartum care s/p repeat c-section: twins (3/10) Active Problems:   Premature rupture of membranes   Dichorionic diamniotic twin pregnancy in third trimester   Previous cesarean delivery affecting pregnancy Mild preeclampsia Depression/anxiety      Discharge diagnosis Twin gestation delivered, PROM, previous Cesarean section, mild preeclampsia                              Depression/anxiety, bipolar disorder                                                               Post partum procedures: MMR vaccine   Complications: None  Hospital course:  Onset of Labor With Unplanned C/S  [age].o. yo [id number] 3670w5ds admitted with premature rupture of membranes on 05/02/2016. Patient had a labor course significant for reproductive assisted pregnancy, DiDi twins, hx previous cesarean section, mild preeclampsia, bipolar disorder, GERD. Membrane Rupture Time/Date:    GriHayleigh, Bawa3[[id number]]:15 AM   GriDerenda FenneltStorm Lake3[[id number]]:15 AM ,   GriMaricarmen, Braziel3[[id number]]/11/2016   GriAdalee, Kathan3[[id number]]/11/2016   The patient went for cesarean section due to Elective Repeat, and delivered a Viable infant,   GriJazzlyn, Huizenga3[[id number]]/11/2016   GriShylynn, Bruning3[[id number]]/11/2016  Details of operation can be found in separate operative note. Patient received Magnesium Sulfate prophylaxis x 24 hours postpartum, good diuresis. Mild range blood pressure post-op, was started  on labetalol 100 mg twce daily, normotensive thereafter. Patient had an uncomplicated postpartum course.  She is ambulating,tolerating a regular diet, passing flatus, and urinating well.  Patient is discharged home in stable condition 05/05/16.  Physical exam  Vitals:   05/04/16 1825 05/05/16 0028 05/05/16 0425 05/05/16 0800  BP: 123/74 132/78 111/67 134/89  Pulse: 82 83 83 81  Resp: '16 18 20 20  ' Temp: 98.7 F (37.1 C) 99.5 F (37.5 C) 97.7 F (36.5 C) 98.1 F (36.7 C)  TempSrc: Oral Oral Oral Oral  SpO2: 97% 98% 96% 99%  Weight:      Height:       General: alert, cooperative and no distress Lochia: appropriate Uterine Fundus: firm Incision: Healing well with no significant drainage DVT Evaluation: Calf/Ankle edema is present Labs: Lab Results  Component Value Date   WBC 10.2 05/03/2016   HGB 9.2 (L) 05/03/2016   HCT 27.2 (L) 05/03/2016   MCV 90.7 05/03/2016   PLT 206 05/03/2016   CMP Latest Ref Rng & Units 05/02/2016  Glucose 65 - 99 mg/dL 73  BUN 6 - 20 mg/dL 12  Creatinine 0.44 - 1.00 mg/dL 0.75  Sodium 135 - 145 mmol/L 133(L)  Potassium 3.5 - 5.1 mmol/L 4.2  Chloride 101 - 111 mmol/L 103  CO2 22 -  32 mmol/L 22  Calcium 8.9 - 10.3 mg/dL 8.7(L)  Total Protein 6.5 - 8.1 g/dL 6.2(L)  Total Bilirubin 0.3 - 1.2 mg/dL 0.3  Alkaline Phos 38 - 126 U/L 284(H)  AST 15 - 41 U/L 52(H)  ALT 14 - 54 U/L 38    Discharge instruction: per After Visit Summary and "Baby and Me Booklet".  After visit meds:  Allergies as of 05/05/2016      Reactions   Ciprofloxacin Nausea Only   Other Swelling, Other (See Comments)   Pt is allergic to seprafilm medication used in abdomen during last laparoscopy surgery. Reaction:  Extreme abdominal swelling and pain      Medication List    STOP taking these medications   cyclobenzaprine 10 MG tablet Commonly known as:  FLEXERIL   DICLEGIS 10-10 MG Tbec Generic drug:  Doxylamine-Pyridoxine   oxyCODONE-acetaminophen 7.5-325 MG  tablet Commonly known as:  PERCOCET     TAKE these medications   albuterol 108 (90 Base) MCG/ACT inhaler Commonly known as:  PROVENTIL HFA;VENTOLIN HFA Inhale 1-2 puffs into the lungs every 6 (six) hours as needed for wheezing or shortness of breath.   buPROPion 100 MG tablet Commonly known as:  WELLBUTRIN Take 200-300 mg by mouth 2 (two) times daily. Pt takes two tablets in the morning and three at bedtime.   folic acid 1 MG tablet Commonly known as:  FOLVITE Take 1 mg by mouth at bedtime.   hydrocortisone cream 1 % Apply topically 3 (three) times daily.   ibuprofen 800 MG tablet Commonly known as:  ADVIL,MOTRIN Take 1 tablet (800 mg total) by mouth every 8 (eight) hours.   iron polysaccharides 150 MG capsule Commonly known as:  NIFEREX Take 1 capsule (150 mg total) by mouth daily. Start taking on:  05/06/2016   labetalol 100 MG tablet Commonly known as:  NORMODYNE Take 1 tablet (100 mg total) by mouth 2 (two) times daily.   magnesium oxide 400 (241.3 Mg) MG tablet Commonly known as:  MAG-OX Take 1 tablet (400 mg total) by mouth daily. Start taking on:  05/06/2016   nitrofurantoin (macrocrystal-monohydrate) 100 MG capsule Commonly known as:  MACROBID Take 100 mg by mouth daily as needed (after intercourse).   oxyCODONE 5 MG immediate release tablet Commonly known as:  Oxy IR/ROXICODONE Take 1 tablet (5 mg total) by mouth every 4 (four) hours as needed for severe pain.   pantoprazole 40 MG tablet Commonly known as:  PROTONIX Take 40 mg by mouth daily.   PRENATAL GUMMIES/DHA & FA 0.4-32.5 MG Chew Chew 2 each by mouth at bedtime.   QUEtiapine 300 MG 24 hr tablet Commonly known as:  SEROQUEL XR Take 600 mg by mouth at bedtime.   valACYclovir 500 MG tablet Commonly known as:  VALTREX Take 500 mg by mouth at bedtime.   zolpidem 10 MG tablet Commonly known as:  AMBIEN Take 10 mg by mouth at bedtime as needed for sleep.       Diet: routine diet  Activity:  Advance as tolerated. Pelvic rest for 6 weeks.   Outpatient follow up: 1 week home RN visit and 6 weeks postpartum follow-up at [facility]  Postpartum contraception: Not Discussed  Newborn Data:   Taja, Pentland [[id number]]  Live born female Sunflower Birth Weight: 5 lb 2.2 oz (2330 g) APGAR: 8, 9   Alaia, Lordi [[id number]]  Live born female Max Birth Weight: 5 lb 4 oz (2380 g) APGAR: 8, 9  Babies  Feeding: Bottle Disposition:home with mother   05/05/2016 Juliene Pina, CNM

## 2016-05-05 NOTE — Progress Notes (Signed)
Patient discharged home with family. Discharge instructions, paperwork, follow-up appts, and prescriptions reviewed. No questions at this time.

## 2016-05-05 NOTE — Progress Notes (Signed)
Subjective: POD# 3 Information for the patient's newborn:  Katina DungGriffith, GirlA Kevyn [[id number]][[id number]]  female Information for the patient's newborn:  Rosezella FloridaGriffith, BoyB Enza [[id number]][[id number]]  female   circ completed Babies names Florentina AddisonKatie and Max  Reports feeling pain at incision site, no narcotic overnight and woke up with sig pain, took Roxycet 2 tabs and starting to feel relief. Patient fell asleep whilst exam ongoing.  Feeding: bottle Patient reports tolerating PO.   Denies HA/SOB/C/P/N/V/dizziness. Flatus present. She reports vaginal bleeding as normal, without clots.  She is ambulating with walker, urinating without difficulty.   L leg swelling sig decreased and feels better since delivery.   Objective:   VS:    Vitals:   05/04/16 1825 05/05/16 0028 05/05/16 0425 05/05/16 0800  BP: 123/74 132/78 111/67 134/89  Pulse: 82 83 83 81  Resp: 16 18 20 20   Temp: 98.7 F (37.1 C) 99.5 F (37.5 C) 97.7 F (36.5 C) 98.1 F (36.7 C)  TempSrc: Oral Oral Oral Oral  SpO2: 97% 98% 96% 99%  Weight:      Height:         Intake/Output Summary (Last 24 hours) at 05/05/16 0846 Last data filed at 05/04/16 1000  Gross per 24 hour  Intake                0 ml  Output              800 ml  Net             -800 ml        Recent Labs  05/03/16 0605  WBC 10.2  HGB 9.2*  HCT 27.2*  PLT 206     Blood type: --/--/O POS (01/13 0721)  Rubella:   equivocal    Physical Exam:  General: alert, cooperative and no distress Abdomen: soft, nontender, normal bowel sounds Incision: clean, dry and intact Uterine Fundus: firm, below umbilicus, nontender Lochia: minimal Ext: edema +1, improved, no calf tenderness or cords      Assessment/Plan: 36 y.o.   POD# 3. [id number]                  Principal Problem:   Postpartum care s/p repeat c-section: twins (3/10) Active Problems:   Premature rupture of membranes   Dichorionic diamniotic twin pregnancy in third trimester   Previous cesarean delivery  affecting pregnancy Mild PEC with stable BP and good diuresis / asymptomatic  - continue Labetalol as estalished            ABL anemia  - oral fe and Mag ox daily            Mild contact dermatitis, improving  - 1% hydrocortisone cream TID PRN  Doing well, stable.     Sig sedation with narcotic and   Seroquel, advised motrin q 6 hrs and oxy 1 tab PRN q 4 hrs                 DC home today w/ instructions  F/U [facility] home [facility] week [facility] and PRN       Neta Mendsaniela C Paul, CNM, MSN 05/05/2016, 8:46 AM

## 2016-05-11 ENCOUNTER — Inpatient Hospital Stay (HOSPITAL_COMMUNITY)
Admission: RE | Admit: 2016-05-11 | Payer: BC Managed Care – PPO | Source: Ambulatory Visit | Admitting: Obstetrics and Gynecology

## 2016-05-11 ENCOUNTER — Encounter (HOSPITAL_COMMUNITY): Admission: RE | Payer: Self-pay | Source: Ambulatory Visit

## 2016-05-11 SURGERY — Surgical Case
Anesthesia: Spinal

## 2016-05-25 ENCOUNTER — Inpatient Hospital Stay (HOSPITAL_COMMUNITY)
Admission: AD | Admit: 2016-05-25 | Payer: BC Managed Care – PPO | Source: Ambulatory Visit | Admitting: Obstetrics and Gynecology

## 2016-06-10 ENCOUNTER — Encounter (HOSPITAL_COMMUNITY): Payer: Self-pay | Admitting: Obstetrics and Gynecology

## 2016-07-31 NOTE — Addendum Note (Signed)
Addendum  created 07/31/16 1046 by Jaxie Racanelli, MD   Sign clinical note    

## 2017-02-23 HISTORY — PX: CHOLECYSTECTOMY: SHX55

## 2017-06-01 DIAGNOSIS — M5126 Other intervertebral disc displacement, lumbar region: Secondary | ICD-10-CM | POA: Insufficient documentation

## 2017-07-30 ENCOUNTER — Encounter (HOSPITAL_COMMUNITY): Payer: Self-pay | Admitting: Emergency Medicine

## 2017-07-30 ENCOUNTER — Emergency Department (HOSPITAL_COMMUNITY)
Admission: EM | Admit: 2017-07-30 | Discharge: 2017-07-31 | Disposition: A | Discharge status: Home / Self Care | Payer: BC Managed Care – PPO | Attending: Emergency Medicine | Admitting: Emergency Medicine

## 2017-07-30 ENCOUNTER — Other Ambulatory Visit: Payer: Self-pay

## 2017-07-30 DIAGNOSIS — Z87891 Personal history of nicotine dependence: Secondary | ICD-10-CM | POA: Diagnosis not present

## 2017-07-30 DIAGNOSIS — K8020 Calculus of gallbladder without cholecystitis without obstruction: Secondary | ICD-10-CM

## 2017-07-30 DIAGNOSIS — R1010 Upper abdominal pain, unspecified: Secondary | ICD-10-CM | POA: Diagnosis present

## 2017-07-30 DIAGNOSIS — Z79899 Other long term (current) drug therapy: Secondary | ICD-10-CM | POA: Insufficient documentation

## 2017-07-30 LAB — CBC
HEMATOCRIT: 40.8 % (ref 36.0–46.0)
Hemoglobin: 13.4 g/dL (ref 12.0–15.0)
MCH: 30.9 pg (ref 26.0–34.0)
MCHC: 32.8 g/dL (ref 30.0–36.0)
MCV: 94.2 fL (ref 78.0–100.0)
PLATELETS: 224 10*3/uL (ref 150–400)
RBC: 4.33 MIL/uL (ref 3.87–5.11)
RDW: 12.1 % (ref 11.5–15.5)
WBC: 8 10*3/uL (ref 4.0–10.5)

## 2017-07-30 NOTE — ED Triage Notes (Signed)
C/o nausea since 1pm.  Reports sharp upper abd pain x 2 hours and vomited x 1. Denies diarrhea.

## 2017-07-31 ENCOUNTER — Emergency Department (HOSPITAL_COMMUNITY): Payer: BC Managed Care – PPO

## 2017-07-31 LAB — COMPREHENSIVE METABOLIC PANEL
ALBUMIN: 4 g/dL (ref 3.5–5.0)
ALT: 30 U/L (ref 14–54)
ANION GAP: 6 (ref 5–15)
AST: 36 U/L (ref 15–41)
Alkaline Phosphatase: 63 U/L (ref 38–126)
BUN: 9 mg/dL (ref 6–20)
CHLORIDE: 107 mmol/L (ref 101–111)
CO2: 25 mmol/L (ref 22–32)
Calcium: 9.1 mg/dL (ref 8.9–10.3)
Creatinine, Ser: 0.87 mg/dL (ref 0.44–1.00)
GFR calc non Af Amer: >60 mL/min (ref >60)
GLUCOSE: 112 mg/dL — AB (ref 65–99)
Potassium: 4.8 mmol/L (ref 3.5–5.1)
SODIUM: 138 mmol/L (ref 135–145)
Total Bilirubin: 0.5 mg/dL (ref 0.3–1.2)
Total Protein: 7.2 g/dL (ref 6.5–8.1)

## 2017-07-31 LAB — URINALYSIS, ROUTINE W REFLEX MICROSCOPIC
BACTERIA UA: NONE SEEN
BILIRUBIN URINE: NEGATIVE
GLUCOSE, UA: NEGATIVE mg/dL
KETONES UR: NEGATIVE mg/dL
NITRITE: NEGATIVE
PH: 6 (ref 5.0–8.0)
Protein, ur: NEGATIVE mg/dL
Specific Gravity, Urine: 1.018 (ref 1.005–1.030)

## 2017-07-31 LAB — I-STAT TROPONIN, ED
TROPONIN I, POC: 0 ng/mL (ref 0.00–0.08)
Troponin i, poc: 0 ng/mL (ref 0.00–0.08)

## 2017-07-31 LAB — I-STAT BETA HCG BLOOD, ED (MC, WL, AP ONLY)

## 2017-07-31 LAB — LIPASE, BLOOD: LIPASE: 32 U/L (ref 11–51)

## 2017-07-31 MED ORDER — SODIUM CHLORIDE 0.9 % IV BOLUS
1000.0000 mL | Freq: Once | INTRAVENOUS | Status: AC
  Administered 2017-07-31: 1000 mL via INTRAVENOUS

## 2017-07-31 MED ORDER — SUCRALFATE 1 G PO TABS: 1.0000 g | ORAL_TABLET | Freq: Three times a day (TID) | ORAL | 0 refills | Status: DC

## 2017-07-31 MED ORDER — ONDANSETRON 4 MG PO TBDP: 4.0000 mg | ORAL_TABLET | Freq: Three times a day (TID) | ORAL | 0 refills | Status: DC | PRN

## 2017-07-31 MED ORDER — IOHEXOL 300 MG/ML  SOLN
100.0000 mL | Freq: Once | INTRAMUSCULAR | Status: AC | PRN
  Administered 2017-07-31: 100 mL via INTRAVENOUS

## 2017-07-31 MED ORDER — ONDANSETRON HCL 4 MG/2ML IJ SOLN
4.0000 mg | Freq: Once | INTRAMUSCULAR | Status: AC
  Administered 2017-07-31: 4 mg via INTRAVENOUS
  Filled 2017-07-31: qty 2

## 2017-07-31 NOTE — ED Provider Notes (Signed)
[facility] EMERGENCY DEPARTMENT Provider Note   CSN: [id number] Arrival date & time: 07/30/17  2256     History   Chief Complaint Chief Complaint  Patient presents with  . Abdominal Pain  . Nausea    HPI Tracy Harris is a 37 y.o. female.  Patient reports gradual onset of nausea about 1 PM.  Symptoms persisted and patient developed sharp upper abdominal pain at about 11 PM that was constant and associated with one episode of vomiting.  She has not had this kind of pain in the past.  She reports she vomited once because of the pain.  She feels improved at this time and states her abdominal pain has resolved.  She is never had this kind of abdominal pain before.  She does take Prilosec for acid reflux but states this is different.  Still has gallbladder and appendix. No pain with urination or blood in the urine.  No vaginal bleeding or discharge.  No diarrhea or constipation.  No chest pain or shortness of breath.  The history is provided by the patient.  Abdominal Pain   Associated symptoms include nausea and vomiting. Pertinent negatives include fever, diarrhea, dysuria, hematuria, headaches, arthralgias and myalgias.    Past Medical History:  Diagnosis Date  . Anxiety   . Bipolar 1 disorder (HCC)   . Dichorionic diamniotic twin pregnancy in third trimester 05/02/2016  . Endometriosis   . GERD (gastroesophageal reflux disease)   . History of anorexia nervosa    hospitalized age 27 and 63  . History of kidney stones   . History of positive PPD   . Mild asthma   . PONV (postoperative nausea and vomiting)   . Postpartum care following cesarean delivery Indication: PROM / Di-Di Twins / Repeat (3/10) 05/02/2016  . Premature rupture of membranes 05/02/2016  . Previous cesarean delivery affecting pregnancy 05/02/2016  . Wears contact lenses     Patient Active Problem List   Diagnosis Date Noted  . Premature rupture of membranes 05/02/2016  . Dichorionic  diamniotic twin pregnancy in third trimester 05/02/2016  . Previous cesarean delivery affecting pregnancy 05/02/2016  . Postpartum care s/p repeat c-section: twins (3/10) 05/02/2016    Past Surgical History:  Procedure Laterality Date  . CESAREAN SECTION  02/11/2011   Procedure: CESAREAN SECTION;  Surgeon: Serita Kyle, MD;  Location: [facility];  Service: Gynecology;  Laterality: N/A;  . CESAREAN SECTION MULTI-GESTATIONAL N/A 05/02/2016   Procedure: CESAREAN SECTION Multiple Gestation;  Surgeon: Maxie Better, MD;  Location: [facility];  Service: Obstetrics;  Laterality: N/A;  . DX  LAPAROSCOPY/  BX LEFT OVARIAN CYST/  ABLATION AND EXCISION ENDOMETRIOSIS/ LYSIS OF ADHESIONS  03-21-2010   and chromopurtubation  . LAPAROSCOPY N/A 09/20/2013   Procedure: LAPAROSCOPY LYSIS OF ADHESIONS,  EXCISION OF ENDOMETRIOSIS RIGHT OVARIAN CYSTECTOMY.;  Surgeon: Fermin Schwab, MD;  Location: [facility];  Service: Gynecology;  Laterality: N/A;  . LASER ABLATION HPV , VAGINA, CERVIX, PERIANAL , AND VULVA  08-15-2002  . TONSILLECTOMY  2011     OB History    Gravida  2   Para  2   Term  1   Preterm  1   AB      Living  3     SAB      TAB      Ectopic      Multiple  1   Live Births  2  Home Medications    Prior to Admission medications   Medication Sig Start Date End Date Taking? Authorizing Provider  albuterol (PROVENTIL HFA;VENTOLIN HFA) 108 (90 Base) MCG/ACT inhaler Inhale 1-2 puffs into the lungs every 6 (six) hours as needed for wheezing or shortness of breath.     [provider]  buPROPion (WELLBUTRIN) 100 MG tablet Take 200-300 mg by mouth 2 (two) times daily. Pt takes two tablets in the morning and three at bedtime.    [provider]  folic acid (FOLVITE) 1 MG tablet Take 1 mg by mouth at bedtime.    [provider]  hydrocortisone cream 1 % Apply topically 3 (three) times daily. 05/05/16   Neta MendsPaul,  Daniela C, CNM  ibuprofen (ADVIL,MOTRIN) 800 MG tablet Take 1 tablet (800 mg total) by mouth every 8 (eight) hours. 05/05/16   Neta MendsPaul, Daniela C, CNM  iron polysaccharides (NIFEREX) 150 MG capsule Take 1 capsule (150 mg total) by mouth daily. 05/06/16   Neta MendsPaul, Daniela C, CNM  labetalol (NORMODYNE) 100 MG tablet Take 1 tablet (100 mg total) by mouth 2 (two) times daily. 05/05/16   Neta MendsPaul, Daniela C, CNM  magnesium oxide (MAG-OX) 400 (241.3 Mg) MG tablet Take 1 tablet (400 mg total) by mouth daily. 05/06/16   Neta MendsPaul, Daniela C, CNM  nitrofurantoin, macrocrystal-monohydrate, (MACROBID) 100 MG capsule Take 100 mg by mouth daily as needed (after intercourse).     [provider]  oxyCODONE (OXY IR/ROXICODONE) 5 MG immediate release tablet Take 1 tablet (5 mg total) by mouth every 4 (four) hours as needed for severe pain. 05/05/16   Neta MendsPaul, Daniela C, CNM  pantoprazole (PROTONIX) 40 MG tablet Take 40 mg by mouth daily.    [provider]  Prenatal MV-Min-FA-Omega-3 (PRENATAL GUMMIES/DHA & FA) 0.4-32.5 MG CHEW Chew 2 each by mouth at bedtime.    [provider]  QUEtiapine (SEROQUEL XR) 300 MG 24 hr tablet Take 600 mg by mouth at bedtime.     [provider]  valACYclovir (VALTREX) 500 MG tablet Take 500 mg by mouth at bedtime.     [provider]  zolpidem (AMBIEN) 10 MG tablet Take 10 mg by mouth at bedtime as needed for sleep.     [provider]    Family History No family history on file.  Social History Social History   Tobacco Use  . Smoking status: Former Smoker    Packs/day: 1.00    Years: 10.00    Pack years: 10.00    Types: Cigarettes    Last attempt to quit: 09/16/2003    Years since quitting: 13.8  . Smokeless tobacco: Never Used  Substance Use Topics  . Alcohol use: Yes    Comment: rare  . Drug use: No     Allergies   Ciprofloxacin and Other   Review of Systems Review of Systems  Constitutional: Positive for activity change and  appetite change. Negative for fever.  HENT: Negative for congestion and rhinorrhea.   Eyes: Negative for visual disturbance.  Respiratory: Negative for cough, chest tightness and shortness of breath.   Cardiovascular: Negative for chest pain.  Gastrointestinal: Positive for abdominal pain, nausea and vomiting. Negative for diarrhea.  Genitourinary: Negative for dysuria, hematuria, vaginal bleeding and vaginal discharge.  Musculoskeletal: Negative for arthralgias and myalgias.  Skin: Negative for rash.  Neurological: Negative for dizziness, weakness and headaches.   all other systems are negative except as noted in the HPI and PMH.  Physical Exam Updated Vital Signs BP 103/68 (BP Location: Left Arm)   Pulse 86   Temp 97.9 F (36.6 C) (Oral)   Resp 18   SpO2 100%   Physical Exam  Constitutional: She is oriented to person, place, and time. She appears well-developed and well-nourished. No distress.  HENT:  Head: Normocephalic and atraumatic.  Mouth/Throat: Oropharynx is clear and moist. No oropharyngeal exudate.  Eyes: Pupils are equal, round, and reactive to light. Conjunctivae and EOM are normal.  Neck: Normal range of motion. Neck supple.  No meningismus.  Cardiovascular: Normal rate, regular rhythm, normal heart sounds and intact distal pulses.  No murmur heard. Pulmonary/Chest: Effort normal and breath sounds normal. No respiratory distress.  Abdominal: Soft. There is tenderness. There is no rebound and no guarding.  Periumbilical tenderness with voluntary guarding.  There is no right upper quadrant tenderness.  Mild epigastric tenderness Negative Murphy sign  Musculoskeletal: Normal range of motion. She exhibits no edema or tenderness.  No CVA tenderness  Neurological: She is alert and oriented to person, place, and time. No cranial nerve deficit. She exhibits normal muscle tone. Coordination normal.   5/5 strength throughout. CN 2-12 intact.Equal grip strength.   Skin:  Skin is warm.  Psychiatric: She has a normal mood and affect. Her behavior is normal.  Nursing note and vitals reviewed.    ED Treatments / Results  Labs (all labs ordered are listed, but only abnormal results are displayed) Labs Reviewed  COMPREHENSIVE METABOLIC PANEL - Abnormal; Notable for the following components:      Result Value   Glucose, Bld 112 (*)    All other components within normal limits  URINALYSIS, ROUTINE W REFLEX MICROSCOPIC - Abnormal; Notable for the following components:   APPearance HAZY (*)    Hgb urine dipstick SMALL (*)    Leukocytes, UA TRACE (*)    Non Squamous Epithelial 0-5 (*)    All other components within normal limits  URINE CULTURE  LIPASE, BLOOD  CBC  I-STAT TROPONIN, ED  I-STAT BETA HCG BLOOD, ED ([facility], [facility], [facility] ONLY)  I-STAT TROPONIN, ED    EKG EKG Interpretation  Date/Time:  Friday July 30 2017 23:33:55 EDT Ventricular Rate:  77 PR Interval:  128 QRS Duration: 82 QT Interval:  370 QTC Calculation: 418 R Axis:   80 Text Interpretation:  Normal sinus rhythm Normal ECG No previous ECGs available Confirmed by Glynn Octave [phone]) on 07/31/2017 5:20:45 AM   Radiology Ct Abdomen Pelvis W Contrast  Result Date: 07/31/2017 CLINICAL DATA:  Acute onset of upper abdominal pain and periumbilical pain. Nausea and vomiting. EXAM: CT ABDOMEN AND PELVIS WITH CONTRAST TECHNIQUE: Multidetector CT imaging of the abdomen and pelvis was performed using the standard protocol following bolus administration of intravenous contrast. CONTRAST:  OMNIPAQUE IOHEXOL 300 MG/ML  SOLN COMPARISON:  CT of the abdomen and pelvis performed 10/04/2006 FINDINGS: Lower chest: The visualized lung bases are grossly clear. The visualized portions of the mediastinum are unremarkable. Hepatobiliary: The liver is unremarkable in appearance. A few tiny stones are suggested within the gallbladder. The gallbladder is otherwise unremarkable. The common bile duct remains normal in  caliber. Pancreas: The pancreas is within normal limits. Spleen: The spleen is unremarkable in appearance. Adrenals/Urinary Tract: The adrenal glands are unremarkable in appearance. The kidneys are within normal limits. There is no evidence of hydronephrosis. No renal or ureteral stones are identified. No perinephric stranding is seen. Stomach/Bowel: The stomach is unremarkable in appearance. The small bowel  is within normal limits. The appendix is normal in caliber, without evidence of appendicitis. The colon is unremarkable in appearance. Vascular/Lymphatic: The abdominal aorta is unremarkable in appearance. The inferior vena cava is grossly unremarkable. No retroperitoneal lymphadenopathy is seen. No pelvic sidewall lymphadenopathy is identified. Reproductive: The bladder is mildly distended and grossly unremarkable. The uterus is unremarkable in appearance. The ovaries are relatively symmetric. No suspicious adnexal masses are seen. Other: No additional soft tissue abnormalities are seen. Musculoskeletal: No acute osseous abnormalities are identified. The visualized musculature is unremarkable in appearance. IMPRESSION: 1. No acute abnormality seen to explain the patient's symptoms. 2. Cholelithiasis; gallbladder otherwise unremarkable in appearance. Electronically Signed   By: Roanna Raider M.D.   On: 07/31/2017 06:46    Procedures Procedures (including critical care time)  Medications Ordered in ED Medications  sodium chloride 0.9 % bolus 1,000 mL (1,000 mLs Intravenous New Bag/Given 07/31/17 0539)  ondansetron (ZOFRAN) injection 4 mg (4 mg Intravenous Given 07/31/17 0548)  iohexol (OMNIPAQUE) 300 MG/ML solution 100 mL (100 mLs Intravenous Contrast Given 07/31/17 0605)     Initial Impression / Assessment and Plan / ED Course  I have reviewed the triage vital signs and the nursing notes.  Pertinent labs & imaging results that were available during my care of the patient were reviewed by me and  considered in my medical decision making (see chart for details).    Patient with upper abdominal pain and nausea and vomiting which is since improved.  On exam she now has periumbilical pain with voluntary guarding.  Labs in triage are reassuring with normal LFTs and lipase.  Patient declines any pain or nausea medication.  On exam she has periumbilical tenderness but no right upper quadrant tenderness.  CT scan obtained to evaluate for occult appendicitis.  This is negative.  She does have a few gallstones but no signs of cholecystitis. Suspect patient's symptoms are likely due to gastritis or acid reflux.  She is already on a PPI.  We will add Carafate.  She has no right upper quadrant tenderness.  Offered ultrasound today which she declines.  Return to the ED with worsening symptoms including pain, fever, vomiting or other concerns.  Final Clinical Impressions(s) / ED Diagnoses   Final diagnoses:  Upper abdominal pain  Gallstones    ED Discharge Orders    None       Deniss Wormley, Jeannett Senior, MD 07/31/17 [phone]

## 2017-07-31 NOTE — ED Notes (Signed)
Returned from CT.

## 2017-07-31 NOTE — Discharge Instructions (Addendum)
Take your stomach medication as prescribed.  Your CT scan shows gallstones but this is unlikely causing your pain today.  Follow-up with the surgeon regarding possible removal of your gallbladder as necessary.  Return to the ED if you develop worsening pain, fever, vomiting or other concerns.

## 2017-07-31 NOTE — ED Notes (Signed)
Patient transported to CT 

## 2017-08-01 ENCOUNTER — Encounter: Payer: Self-pay | Admitting: Family Medicine

## 2017-08-01 LAB — URINE CULTURE: SPECIAL REQUESTS: NORMAL

## 2017-08-16 IMAGING — US US MFM OB TRANSVAGINAL
1 series · 16 of 28 positions shown · non-contrast
Comparison: none

[Series 1: us mfm ob transvaginal · 182 acquisitions, 16 frames shown]
[im 1/182]
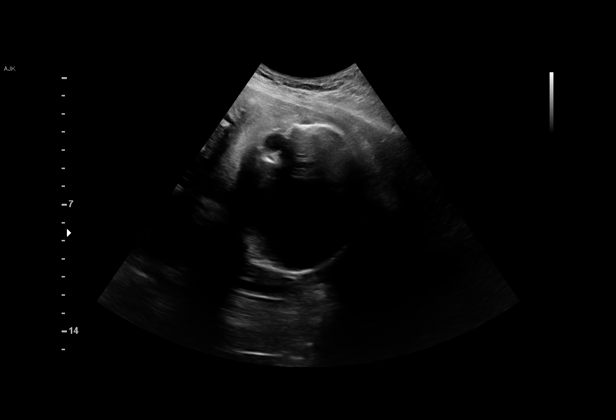
[im 14/182]
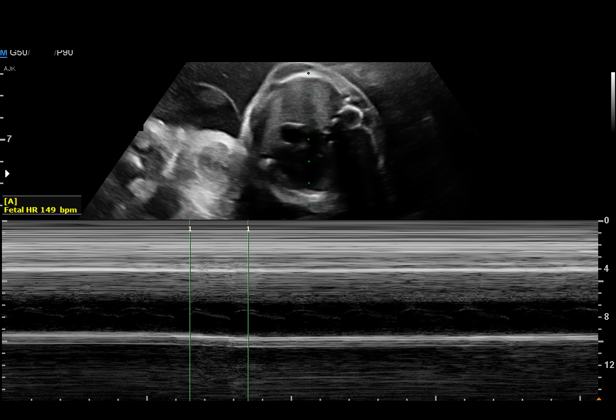
[im 27/182]
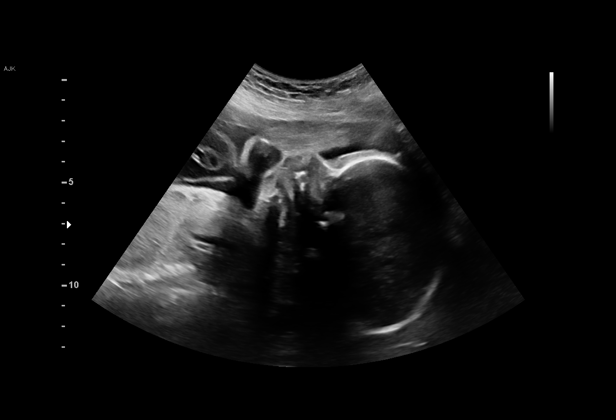
[im 41/182]
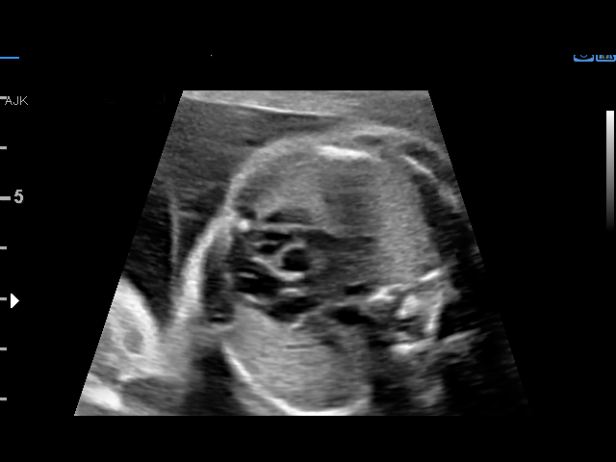
[im 47/182]
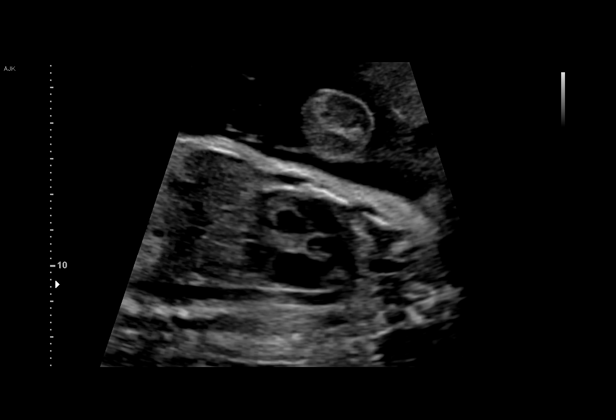
[im 61/182]
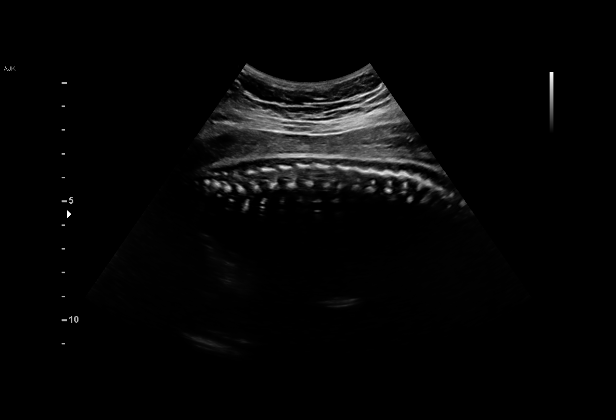
[im 74/182]
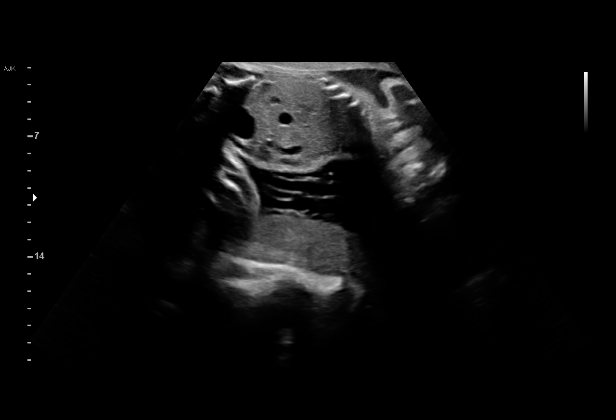
[im 88/182]
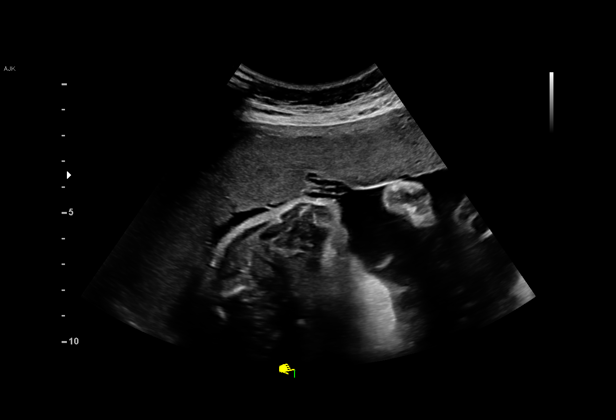
[im 94/182]
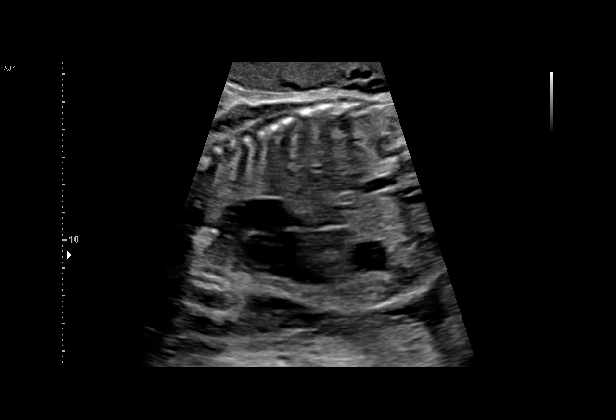
[im 108/182]
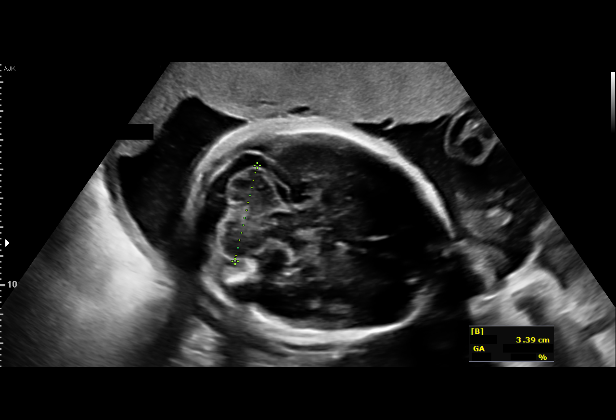
[im 121/182]
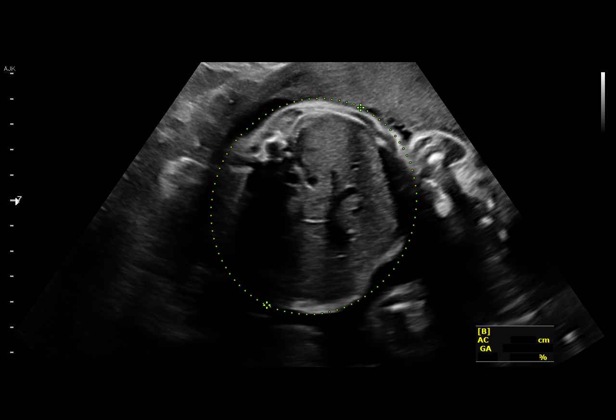
[im 135/182]
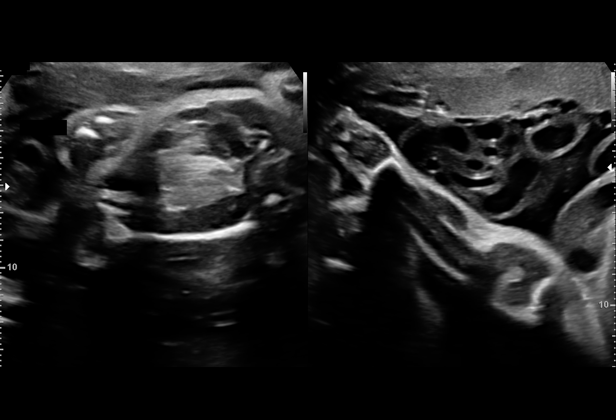
[im 141/182]
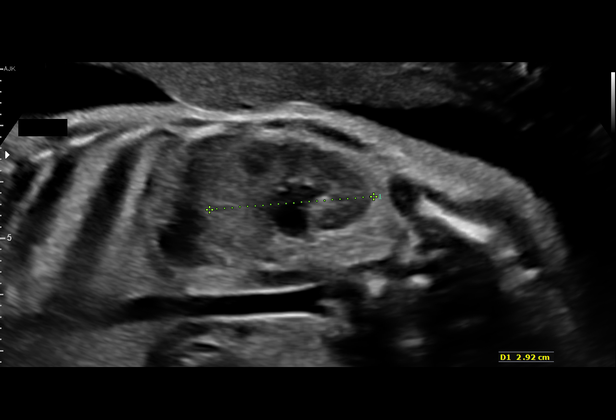
[im 155/182]
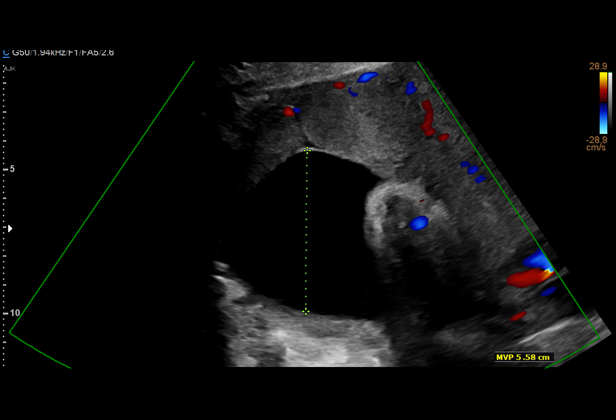
[im 168/182]
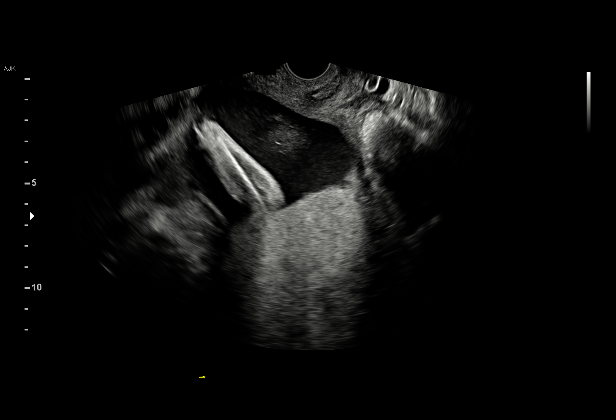
[im 182/182]
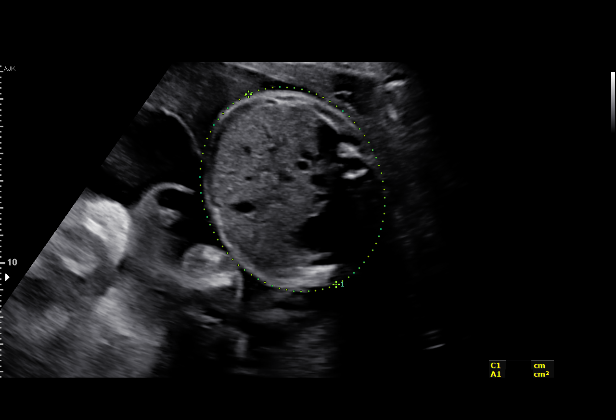

[16 of 28 positions shown; findings below may reference images not displayed]

[facility]

WK

1  NAIDU               [id number]      [id number]     [id number]
TIGER
2  NAIDU               [id number]      [id number]     [id number]
TIGER
3  NAIDU               [id number]      [id number]     [id number]
TIGER
4  NAIDU               [id number]      [id number]     [id number]
TIGER
Indications

28 weeks gestation of pregnancy
Advanced maternal age multigravida 35+,
second trimester; 21 y/o ova - low risk first
trimester screen
Pregnancy resulting from assisted
reproductive technology (IVF); donor eggs
(21 y/o)
Abnormal fetal ultrasound (B-
ventriculomegaly)
Low lying placenta, antepartum
Twin pregnancy, di/di, third trimester
Encounter for antenatal screening for
malformations
Maternal care for known or suspected poor
fetal growth, third trimester, fetus 1
OB History

Blood Type:            Height:  5'3"   Weight (lb):  180      BMI:
Gravidity:    2         Term:   1        Prem:   0        SAB:   0
TOP:          0       Ectopic:  0        Living: 1
Fetal Evaluation (Fetus A)

Num Of Fetuses:     2
Fetal Heart         149
Rate(bpm):
Cardiac Activity:   Observed
Presentation:       Cephalic
Placenta:           Posterior, above cervical os
P. Cord Insertion:  Not well visualized

Amniotic Fluid
AFI FV:      Subjectively within normal limits

Largest Pocket(cm)
6.39
Biometry (Fetus A)

BPD:      73.5  mm     G. Age:  29w 4d         72  %    CI:        79.47   %   70 - 86
FL/HC:      19.6   %   18.8 -
HC:      260.6  mm     G. Age:  28w 3d         18  %    HC/AC:      1.18       1.05 -
AC:       221   mm     G. Age:  26w 4d          5  %    FL/BPD:     69.7   %   71 - 87
FL:       51.2  mm     G. Age:  27w 3d         12  %    FL/AC:      23.2   %   20 - 24
CER:      34.3  mm     G. Age:  29w 5d         71  %

Est. FW:    0979  gm      2 lb 5 oz     26  %     FW Discordancy        21  %
Gestational Age (Fetus A)

U/S Today:     28w 0d                                        EDD:   05/28/16
Best:          28w 3d    Det. By:   D.O. Conception          EDD:   05/25/16
(09/02/15)
Anatomy (Fetus A)

Cranium:               Appears normal         Aortic Arch:            Appears normal
Cavum:                 Appears normal         Ductal Arch:            Appears normal
Ventricles:            Appears normal         Diaphragm:              Appears normal
Choroid Plexus:        Appears normal         Stomach:                Appears normal, left
sided
Cerebellum:            Appears normal         Abdomen:                Appears normal
Posterior Fossa:       Appears normal         Abdominal Wall:         Appears nml (cord
insert, abd wall)
Nuchal Fold:           Not applicable (>20    Cord Vessels:           Appears normal (3
wks GA)                                        vessel cord)
Face:                  Appears normal         Kidneys:                Appear normal
(orbits and profile)
Lips:                  Appears normal         Bladder:                Appears normal
Thoracic:              Appears normal         Spine:                  Appears normal
Heart:                 Appears normal         Upper Extremities:      Appears normal
(4CH, axis, and situs
RVOT:                  Appears normal         Lower Extremities:      Appears normal
LVOT:                  Appears normal

Other:  Fetus appears to be a female. Nasal bone visualized. Technically
difficult due to fetal position.
Doppler - Fetal Vessels (Fetus A)

Umbilical Artery
S/D     %tile     RI              PI              PSV
(cm/s)
2.79       40

Fetal Evaluation (Fetus B)

Num Of Fetuses:     2
Fetal Heart         142
Rate(bpm):
Cardiac Activity:   Observed
Presentation:       Breech
Placenta:           Anterior, above cervical os
P. Cord Insertion:  Visualized, central
Membrane Desc:      Dividing Membrane seen - Dichorionic.

Amniotic Fluid
AFI FV:      Subjectively within normal limits

Largest Pocket(cm)
5.58
Biometry (Fetus B)

BPD:      71.1  mm     G. Age:  28w 4d         42  %    CI:        70.35   %   70 - 86
FL/HC:      19.2   %   18.8 -
HC:      270.3  mm     G. Age:  29w 3d         51  %    HC/AC:      1.05       1.05 -
AC:      256.8  mm     G. Age:  29w 6d         82  %    FL/BPD:     72.9   %   71 - 87
FL:       51.8  mm     G. Age:  27w 4d         17  %    FL/AC:      20.2   %   20 - 24
CER:      33.5  mm     G. Age:  29w 2d         62  %

Est. FW:    8120  gm    2 lb 15 oz      62  %     FW Discordancy     0 \ 21 %
Gestational Age (Fetus B)

U/S Today:     28w 6d                                        EDD:   05/22/16
Best:          28w 3d    Det. By:   D.O. Conception          EDD:   05/25/16
(09/02/15)
Anatomy (Fetus B)

Cranium:               Appears normal         Aortic Arch:            Appears normal
Cavum:                 Appears normal         Ductal Arch:            Appears normal
Ventricles:            Appears normal         Diaphragm:              Appears normal
Choroid Plexus:        Appears normal         Stomach:                Appears normal, left
sided
Cerebellum:            Appears normal         Abdomen:                Appears normal
Posterior Fossa:       Appears normal         Abdominal Wall:         Appears nml (cord
insert, abd wall)
Nuchal Fold:           Not applicable (>20    Cord Vessels:           Appears normal (3
wks GA)                                        vessel cord)
Face:                  Not well visualized    Kidneys:                Appear normal
Lips:                  Appears normal         Bladder:                Appears normal
Thoracic:              Appears normal         Spine:                  Not well visualized
Heart:                 Appears normal         Upper Extremities:      Appears normal
(4CH, axis, and situs
RVOT:                  Appears normal         Lower Extremities:      Appears normal
LVOT:                  Appears normal

Other:  Fetus appears to be a male. Technically difficult due to fetal position.
Cervix Uterus Adnexa

Cervix
Length:            3.3  cm.
Normal appearance by transvaginal scan

Uterus
No abnormality visualized.

Left Ovary
Not visualized.

Right Ovary
Not visualized.

Adnexa:       No abnormality visualized.
Impression

Dichorionic/diamniotic twin pregnancy at 28+3 weeks

Twin A:
Normal detailed fetal anatomy
Normal amniotic fluid volume
Measurements consistent with IVF dating; EFW at the 26th
%tile; AC at the 5th %tile
UA dopplers were normal for this GA

Twin B:
Normal detailed fetal anatomy; limited views of face and
spine; left lateral ventricle measured 8.5 mms (WNL)
Normal amniotic fluid volume
Measurements consistent with IVF dating; EFW at the 62nd
%tile

EV views of cervix: normal length without funneling; no previa

The US findings were shared with Ms. Thi and her
husband. The implications of borderline ventricular
measurements and possible early fetal growth restriction
were discussed in detail.
Recommendations

Follow-up ultrasound for growth in 3 weeks

## 2017-09-06 IMAGING — US US MFM UA CORD DOPPLER
1 series · 12 of 28 positions shown · non-contrast
Comparison: none

[Series 1: us mfm ua cord doppler · 29 acquisitions, 12 frames shown]
[im 2/29]
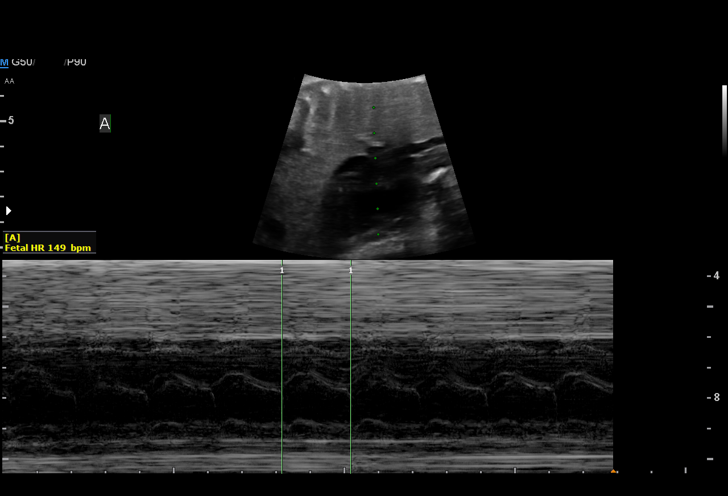
[im 4/29]
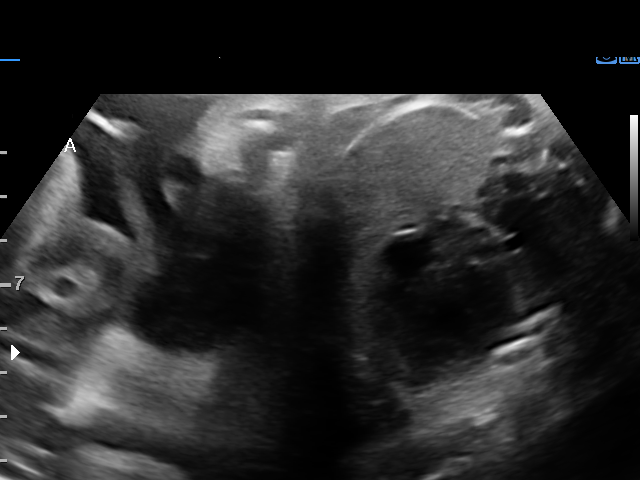
[im 6/29]
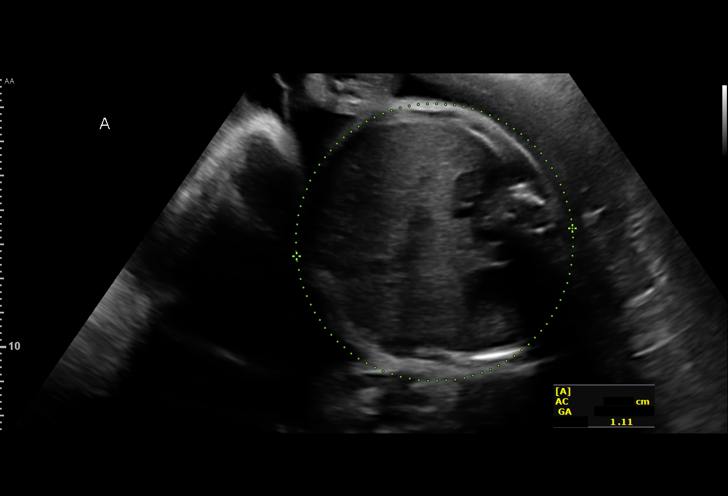
[im 9/29]
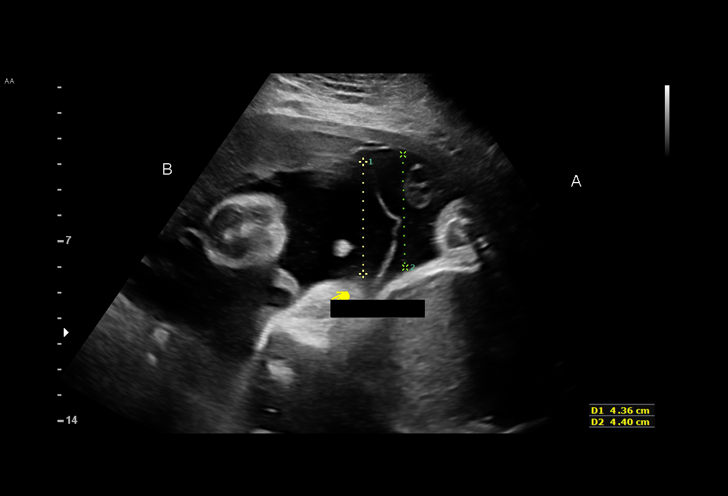
[im 11/29]
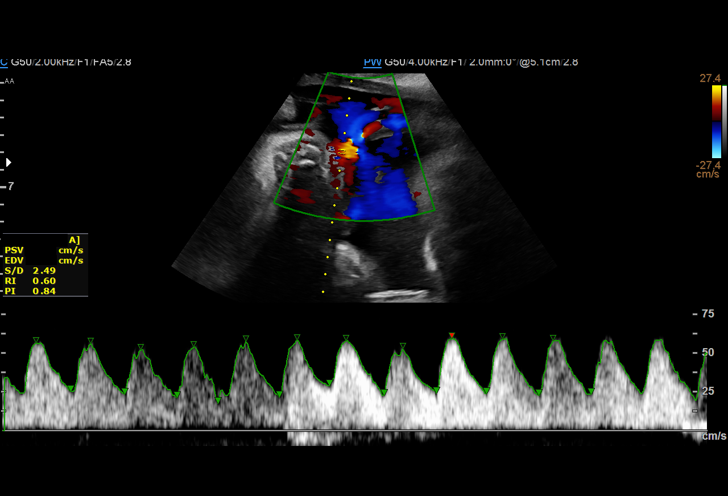
[im 13/29]
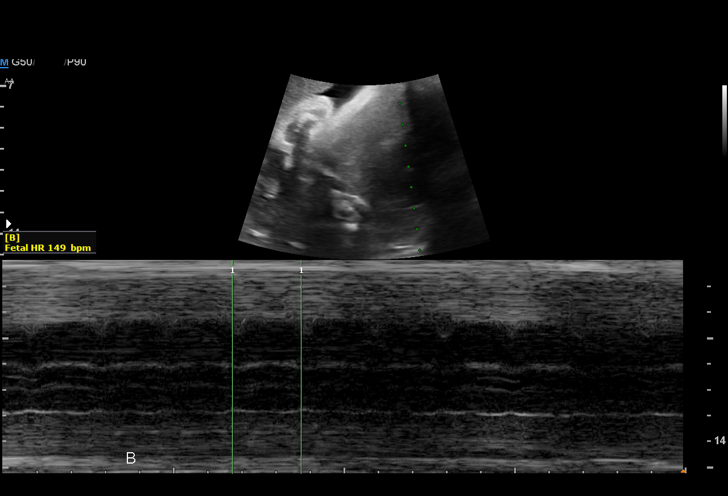
[im 16/29]
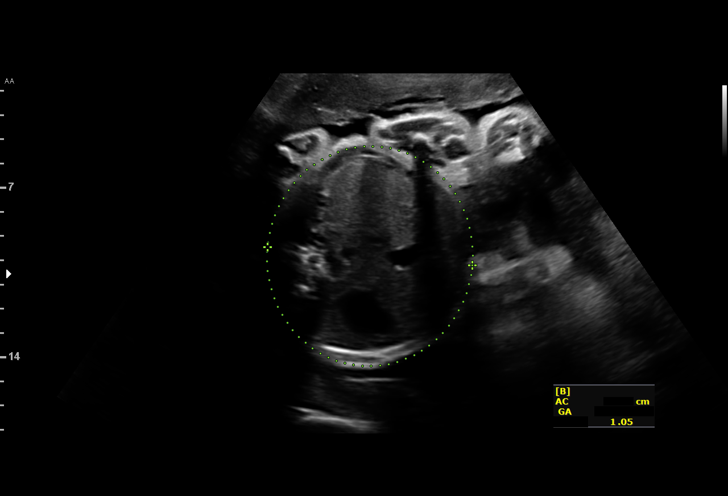
[im 18/29]
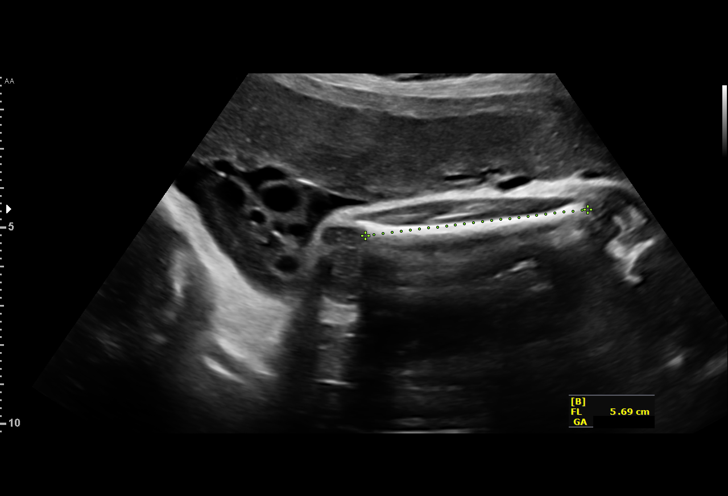
[im 20/29]
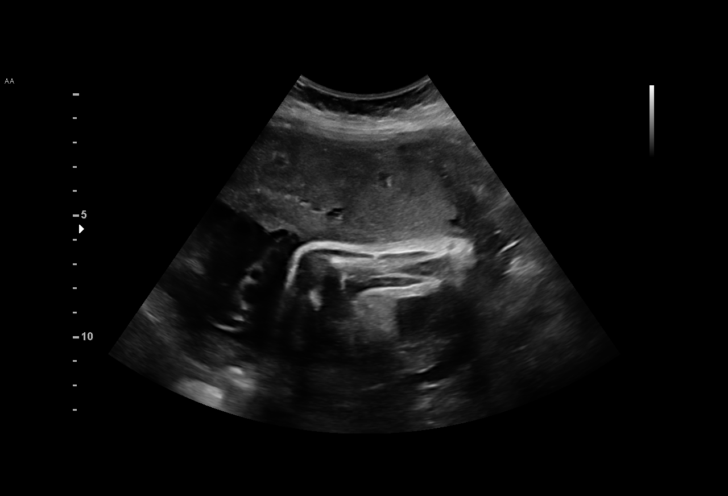
[im 23/29]
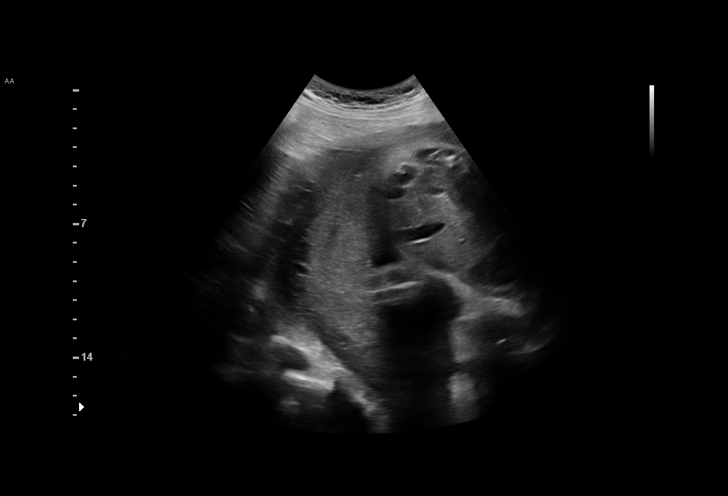
[im 25/29]
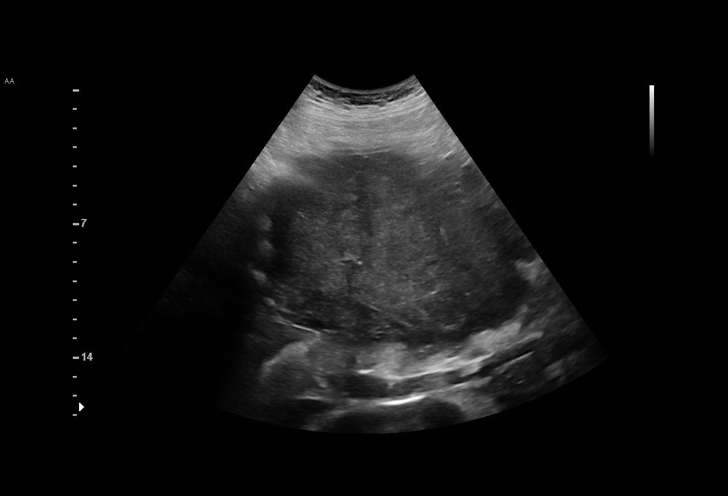
[im 27/29]
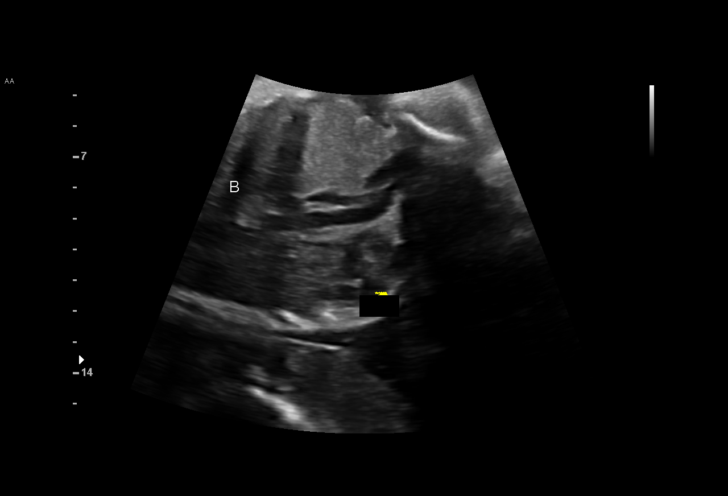

[12 of 28 positions shown; findings below may reference images not displayed]

[facility]
[id number] [facility] [place]
BEN OTHMEN

1  ASAL SUMPTER            [id number]      [id number]     [id number]
2  ASAL SUMPTER            [id number]      [id number]     [id number]
3  ASAL SUMPTER            [id number]      [id number]     [id number]
Indications

31 weeks gestation of pregnancy
Pregnancy resulting from assisted
reproductive technology (IVF); donor eggs
(21 y/o)
Twin pregnancy, di/di, third trimester
Maternal care for known or suspected poor
fetal growth, third trimester, fetus 1
Advanced maternal age multigravida 35+,
third trimester; 21 y/o ova - low risk first
trimester screen
OB History

Blood Type:            Height:  5'3"   Weight (lb):  180       BMI:
Gravidity:    2         Term:   1        Prem:   0        SAB:   0
TOP:          0       Ectopic:  0        Living: 1
Fetal Evaluation (Fetus A)

Num Of Fetuses:     2
Preg. Location:     Intrauterine
Fetal Heart         149
Rate(bpm):
Cardiac Activity:   Observed
Presentation:       Cephalic
Placenta:           Posterior Fundal, above cervical os
P. Cord Insertion:  Visualized, central
Amniotic Fluid
AFI FV:      Subjectively within normal limits

Largest Pocket(cm)
4.4
Biometry (Fetus A)

BPD:      77.6  mm     G. Age:  31w 1d         31  %    CI:        76.45   %    70 - 86
FL/HC:       20.5  %    19.3 -
HC:      281.2  mm     G. Age:  30w 6d          7  %    HC/AC:       1.12       0.96 -
AC:      251.3  mm     G. Age:  29w 2d          5  %    FL/BPD:      74.4  %    71 - 87
FL:       57.7  mm     G. Age:  30w 1d         10  %    FL/AC:       23.0  %    20 - 24

Est. FW:    5744   gm     3 lb 4 oz     25  %     FW Discordancy        12  %
Gestational Age (Fetus A)

U/S Today:     30w 3d                                        EDD:   06/01/16
Best:          31w 3d     Det. By:  D.O. Conception          EDD:   05/25/16
(09/02/15)
Anatomy (Fetus A)

Cranium:               Appears normal         Aortic Arch:            Previously seen
Cavum:                 Previously seen        Ductal Arch:            Previously seen
Ventricles:            Previously seen        Diaphragm:              Appears normal
Choroid Plexus:        Previously seen        Stomach:                Appears normal, left
sided
Cerebellum:            Previously seen        Abdomen:                Previously seen
Posterior Fossa:       Previously seen        Abdominal Wall:         Previously seen
Nuchal Fold:           Not applicable (>20    Cord Vessels:           Previously seen
wks GA)
Face:                  Orbits and profile     Kidneys:                Appear normal
previously seen
Lips:                  Previously seen        Bladder:                Appears normal
Thoracic:              Previously seen        Spine:                  Previously seen
Heart:                 Previously seen        Upper Extremities:      Previously seen
RVOT:                  Previously seen        Lower Extremities:      Previously seen
LVOT:                  Previously seen

Other:  Fetus appears to be a female. Nasal bone previously visualized.
Technically difficult due to fetal position.
Doppler - Fetal Vessels (Fetus A)

Umbilical Artery
S/D     %tile     RI              PI              PSV
(cm/s)
2.49        37

Fetal Evaluation (Fetus B)

Num Of Fetuses:     2
Fetal Heart         149
Rate(bpm):
Cardiac Activity:   Observed
Presentation:       Breech
Placenta:           Anterior, above cervical os
Membrane Desc:      Dividing Membrane seen - Dichorionic.
Amniotic Fluid
AFI FV:      Subjectively within normal limits

Largest Pocket(cm)
4.4
Biometry (Fetus B)

BPD:      76.4  mm     G. Age:  30w 5d         18  %    CI:        70.87   %    70 - 86
FL/HC:       19.5  %    19.3 -
HC:      289.2  mm     G. Age:  31w 6d         24  %    HC/AC:       1.05       0.96 -
AC:        276  mm     G. Age:  31w 5d         54  %    FL/BPD:      73.8  %    71 - 87
FL:       56.4  mm     G. Age:  29w 5d          5  %    FL/AC:       20.4  %    20 - 24

Est. FW:    8082   gm    3 lb 11 oz     46  %     FW Discordancy     0 \ 12 %
Gestational Age (Fetus B)

U/S Today:     31w 0d                                        EDD:   05/28/16
Best:          31w 3d     Det. By:  D.O. Conception          EDD:   05/25/16
(09/02/15)
Anatomy (Fetus B)

Cranium:               Appears normal         Aortic Arch:            Previously seen
Cavum:                 Previously seen        Ductal Arch:            Previously seen
Ventricles:            Previously seen        Diaphragm:              Appears normal
Choroid Plexus:        Previously seen        Stomach:                Appears normal, left
sided
Cerebellum:            Previously seen        Abdomen:                Previously seen
Posterior Fossa:       Previously seen        Abdominal Wall:         Previously seen
Nuchal Fold:           Not applicable (>20    Cord Vessels:           Previously seen
wks GA)
Face:                  Not well visualized    Kidneys:                Appear normal
Lips:                  Previously seen        Bladder:                Appears normal
Thoracic:              Previously seen        Spine:                  Not well visualized
Heart:                 Previously seen        Upper Extremities:      Previously seen
RVOT:                  Previously seen        Lower Extremities:      Previously seen
LVOT:                  Previously seen

Other:  Fetus appears to be a male. Technically difficult due to fetal position.
Impression

Dichorionic/diamniotic twin pregnancy at 31+3 weeks

Twin A
Normal interval anatomy; anatomic survey complete
Normal amniotic fluid volume
Appropriate interval growth with EFW at the 25th %tile; AC at
the 5th %tile
UA dopplers were normal for this GA  (37th %tile)

Twin B
Normal interval anatomy; anatomic survey complete except
for spine
Normal amniotic fluid volume
Appropriate interval growth with EFW at the 46th %tile
Recommendations
Follow-up ultrasound for growth in 3 weeks

## 2017-09-25 IMAGING — US US MFM OB FOLLOW-UP
1 series · 15 of 28 positions shown · non-contrast
Comparison: none

[Series 1: us mfm ob follow-up · 15 of 56 slices shown]
[im 1/56]
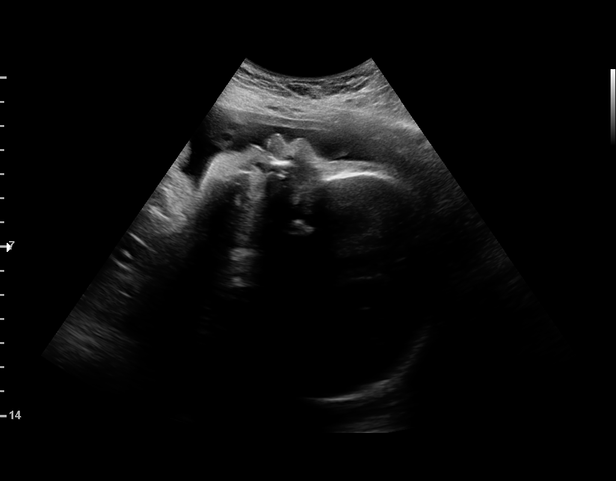
[im 5/56]
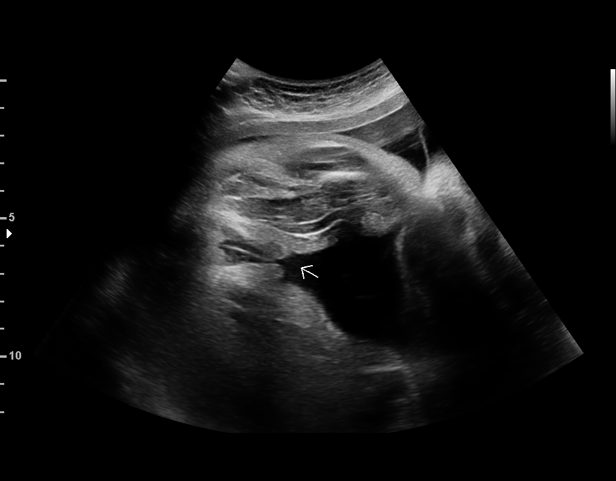
[im 9/56]
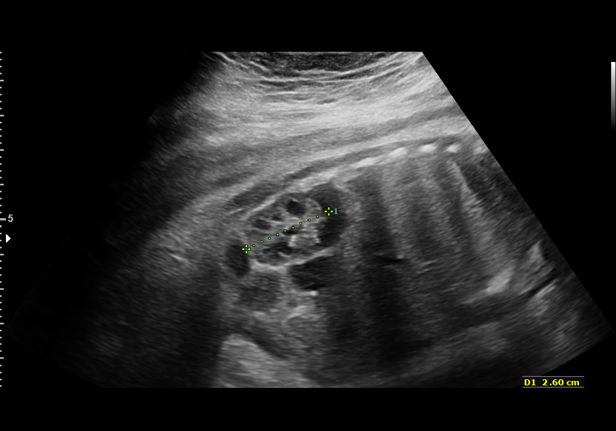
[im 13/56]
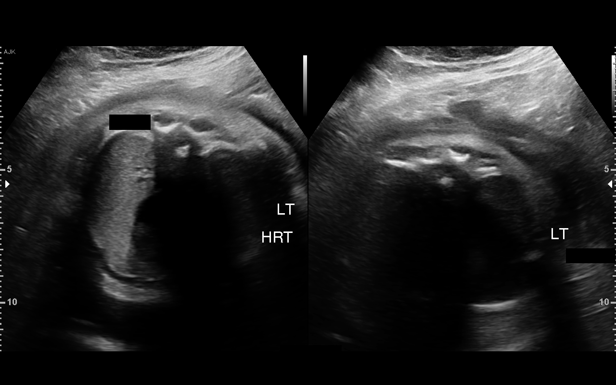
[im 17/56]
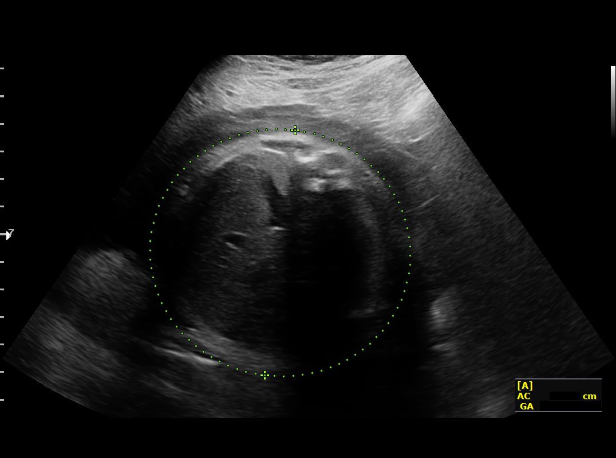
[im 21/56]
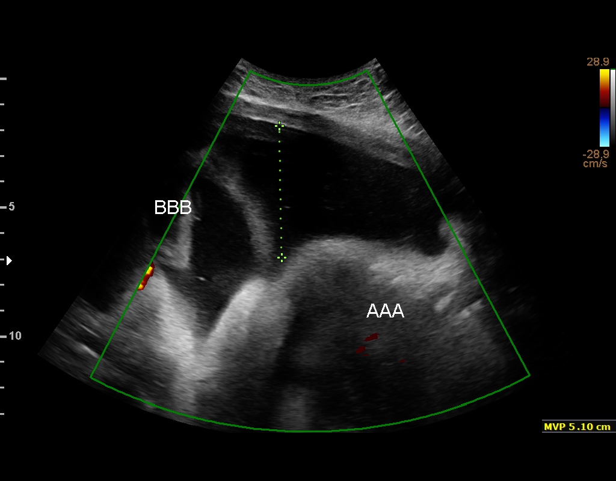
[im 25/56]
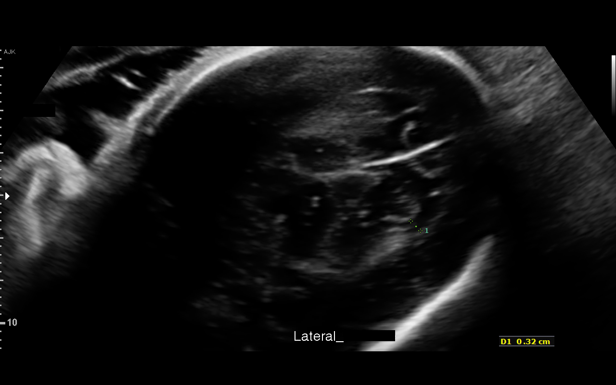
[im 29/56]
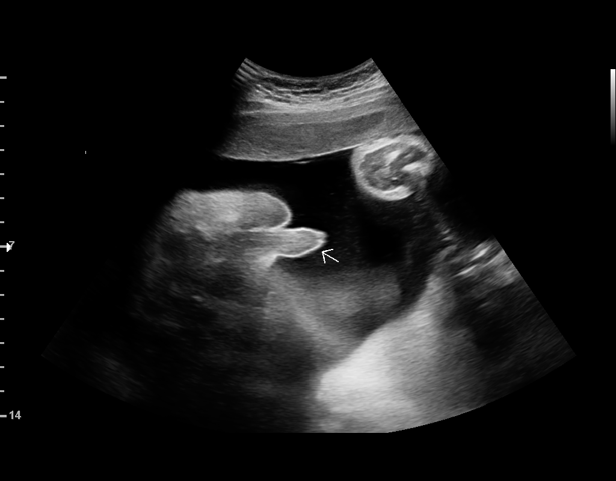
[im 31/56]
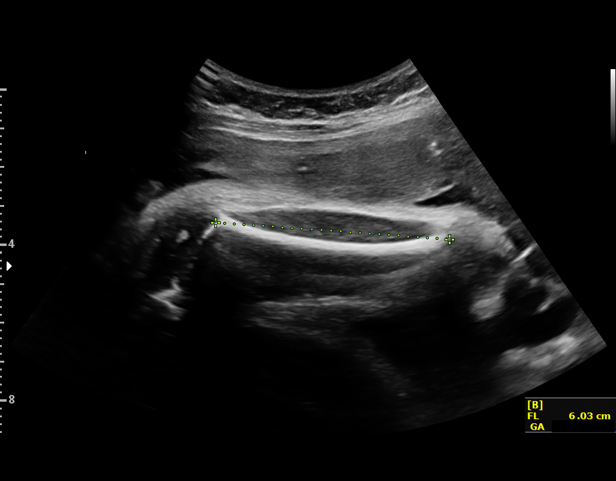
[im 35/56]
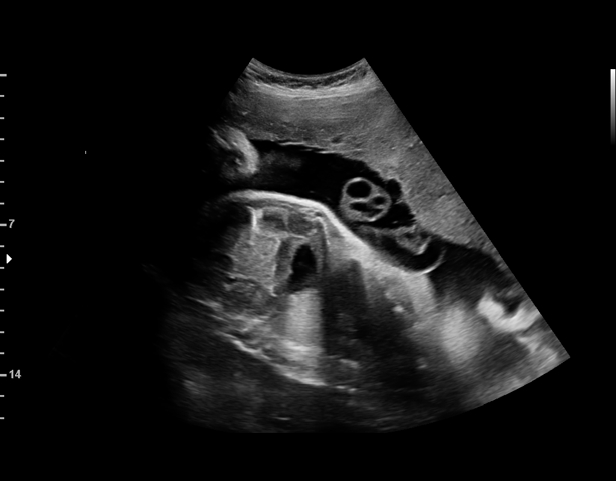
[im 39/56]
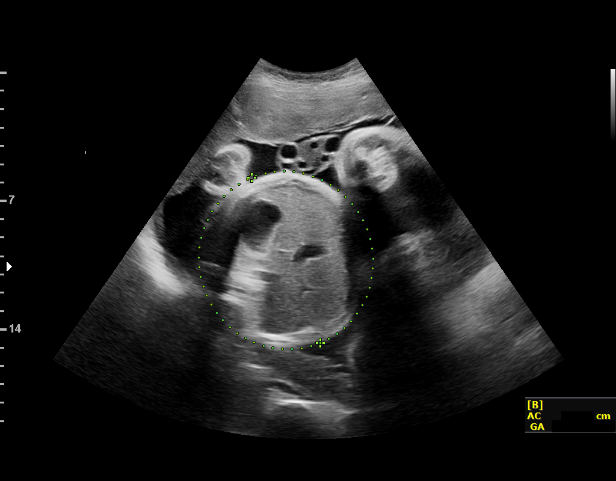
[im 43/56]
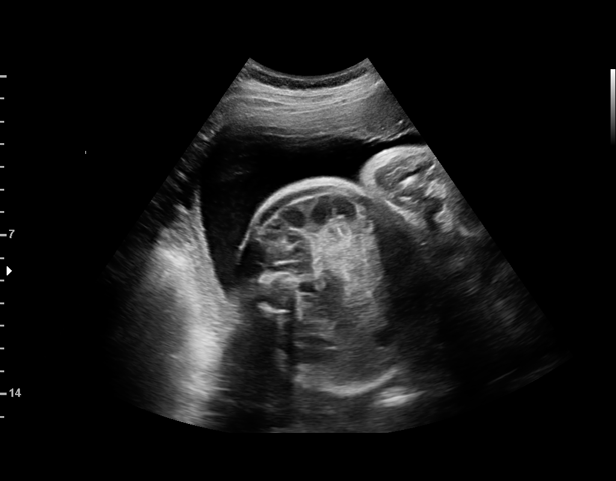
[im 47/56]
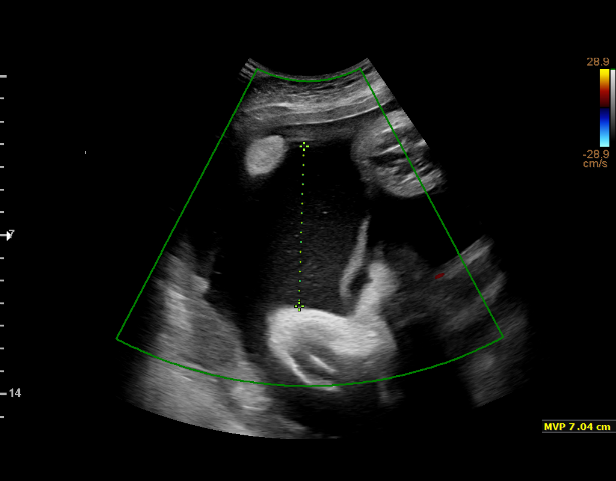
[im 51/56]
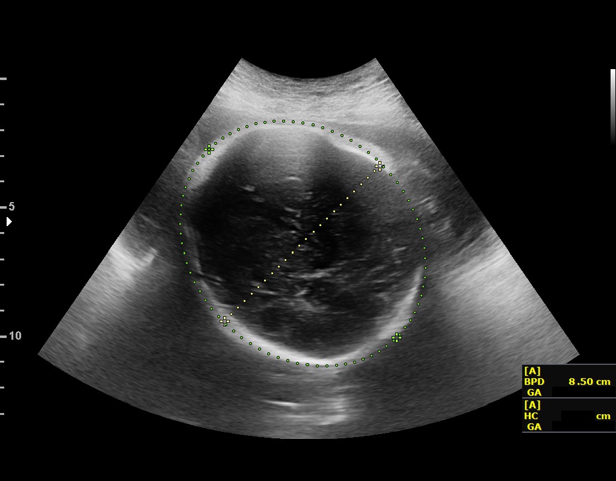
[im 56/56]
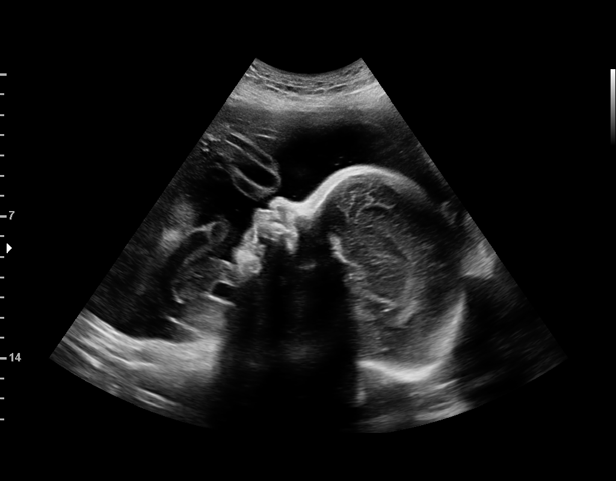

[15 of 28 positions shown; findings below may reference images not displayed]

[facility]
[id number] [REDACTED]
[REDACTED]7
[place]

1  ROBERTO TIGER            [id number]      [id number]     [id number]
2  ROBERTO TIGER            [id number]      [id number]     [id number]
Indications

34 weeks gestation of pregnancy
Pregnancy resulting from assisted
reproductive technology (IVF); donor eggs
(21 y/o)
Twin pregnancy, di/di, third trimester
Maternal care for known or suspected poor
fetal growth, third trimester, fetus 1
Advanced maternal age multigravida 35+,
third trimester; 21 y/o ova - low risk first
trimester screen
Previous cesarean delivery, antepartum
OB History

Blood Type:            Height:  5'3"   Weight (lb):  180      BMI:
Gravidity:    2         Term:   1        Prem:   0        SAB:   0
TOP:          0       Ectopic:  0        Living: 1
Fetal Evaluation (Fetus A)

Num Of Fetuses:     2
Fetal Heart         141
Rate(bpm):
Cardiac Activity:   Observed
Fetal Lie:          Maternal left side
Presentation:       Cephalic
Amniotic Fluid
AFI FV:      Subjectively within normal limits

Largest Pocket(cm)
5.1
Biometry (Fetus A)

BPD:      84.3  mm     G. Age:  33w 6d         42  %    CI:        79.13   %   70 - 86
FL/HC:      20.6   %   19.4 -
HC:      299.6  mm     G. Age:  33w 2d          5  %    HC/AC:      1.03       0.96 -
AC:      290.2  mm     G. Age:  33w 0d         23  %    FL/BPD:     73.3   %   71 - 87
FL:       61.8  mm     G. Age:  32w 0d          5  %    FL/AC:      21.3   %   20 - 24

Est. FW:    4098  gm      4 lb 9 oz     35  %     FW Discordancy         3  %
Gestational Age (Fetus A)

U/S Today:     33w 0d                                        EDD:   06/02/16
Best:          34w 1d    Det. By:   D.O. Conception          EDD:   05/25/16
(09/02/15)
Anatomy (Fetus A)

Cranium:               Appears normal         Aortic Arch:            Previously seen
Cavum:                 Appears normal         Ductal Arch:            Previously seen
Ventricles:            Previously seen        Diaphragm:              Appears normal
Choroid Plexus:        Previously seen        Stomach:                Appears normal, left
sided
Cerebellum:            Previously seen        Abdomen:                Appears normal
Posterior Fossa:       Previously seen        Abdominal Wall:         Previously seen
Nuchal Fold:           Not applicable (>20    Cord Vessels:           Previously seen
wks GA)
Face:                  Orbits and profile     Kidneys:                Appear normal
previously seen
Lips:                  Previously seen        Bladder:                Appears normal
Thoracic:              Previously seen        Spine:                  Previously seen
Heart:                 Previously seen        Upper Extremities:      Previously seen
RVOT:                  Previously seen        Lower Extremities:      Previously seen
LVOT:                  Previously seen

Other:  Fetus appears to be a female. Nasal bone previously visualized.
Technically difficult due to fetal position.
Doppler - Fetal Vessels (Fetus A)

Umbilical Artery
S/D     %tile     RI              PI              PSV
(cm/s)
2.74       60

Fetal Evaluation (Fetus B)

Num Of Fetuses:     2
Fetal Heart         146
Rate(bpm):
Cardiac Activity:   Observed
Fetal Lie:          Maternal right side
Presentation:       Cephalic
Amniotic Fluid
AFI FV:      Subjectively within normal limits

Largest Pocket(cm)
7.04
Biometry (Fetus B)

BPD:      79.8  mm     G. Age:  32w 0d          4  %    CI:         68.2   %   70 - 86
FL/HC:      19.4   %   19.4 -
HC:       309   mm     G. Age:  34w 4d         23  %    HC/AC:      1.02       0.96 -
AC:      301.7  mm     G. Age:  34w 1d         54  %    FL/BPD:     75.1   %   71 - 87
FL:       59.9  mm     G. Age:  31w 1d        < 3  %    FL/AC:      19.9   %   20 - 24

Est. FW:    9496  gm    4 lb 11 oz      40  %     FW Discordancy      0 \ 3 %
Gestational Age (Fetus B)

U/S Today:     33w 0d                                        EDD:   06/02/16
Best:          34w 1d    Det. By:   D.O. Conception          EDD:   05/25/16
(09/02/15)
Anatomy (Fetus B)

Cranium:               Appears normal         Aortic Arch:            Previously seen
Cavum:                 Appears normal         Ductal Arch:            Previously seen
Ventricles:            Previously seen        Diaphragm:              Appears normal
Choroid Plexus:        Previously seen        Stomach:                Appears normal, left
sided
Cerebellum:            Previously seen        Abdomen:                Appears normal
Posterior Fossa:       Previously seen        Abdominal Wall:         Previously seen
Nuchal Fold:           Previously seen        Cord Vessels:           Previously seen
Face:                  Orbits appear          Kidneys:                Appear normal
normal
Lips:                  Previously seen        Bladder:                Appears normal
Thoracic:              Appears normal         Spine:                  Previously seen
Heart:                 Previously seen        Upper Extremities:      Previously seen
RVOT:                  Previously seen        Lower Extremities:      Previously seen
LVOT:                  Previously seen
Doppler - Fetal Vessels (Fetus B)

Umbilical Artery
S/D     %tile     RI              PI              PSV
(cm/s)
2.3       36

Impression

Dichorionic/diamniotic twin pregnancy at 34+1 weeks
Cephalic/cephalic presentation
Normal interval anatomy x 2; anatomic survey complete x 2
Normal amniotic fluid volume x 2
Twin A:  EFW at the 35th %tile; AC at the 23rd %tile; UA
dopplers were normal for this GA
Twin B: EFW at the 40th %tile; AC at the 54th %tile;  UA
dopplers were normal for this GA
Recommendations

Suggest antenatal testing until delivery
Deliver by 38 weeks

## 2017-10-04 ENCOUNTER — Ambulatory Visit: Payer: Self-pay | Admitting: Surgery

## 2017-10-04 NOTE — H&P (Signed)
Tracy Harris Documented: 10/04/2017 3:53 Harris Location: [facility] Patient #: [id number] DOB: 1980-05-11 Married / Language: [place] / Race: White Female  History of Present Illness Tracy Sportsman Harris; 10/04/2017 4:45 Harris) The patient is a 37 year old female who presents for evaluation of gall stones. Note for "Gall stones": ` ` ` Patient sent for surgical consultation at the request of Dr Rancour  Chief Complaint: Upper abdominal pain nausea and vomiting with gallstones. ` ` The patient is a pleasant woman in said abdominal issues in the past. Endometriosis requiring 2 ablations and controlled with NuvaRing. History of kidney stones. History of heartburn and reflux controlled with Protonix. In June, she had an attack of severe upper abdominal pain. He woke her up. Pain was more focal in the right subcostal region. Felt nauseated and threw up. Pain was extremely intense. Concern her. [facility]. It took 6 hours to get in and be evaluated. By then her pain was less. No significant abnormal laboratory values. CT scan ruled out any kidney stone or bowel obstruction. A few calcified stones noted in her gallbladder. No major thickening though. Her pain did not seem classic for biliary colic times a day held off on surgical evaluation. However she was concerned. Surgical consultation requested. Hard to know if it was remembered seeing her primary care physician.  Patient has struggled with some weight gain or weight loss. She tends to avoid any greasy foods. She does not recall when she ate that day. Normally he eats most things without too much difficulty. No dysphasia to solids or liquids. No history of hepatitis. She rarely drinks alcohol. Usually moves her bowels every day. Occasionally loose bowels with anxiety in the past but that is much better. 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 Stays quite active. She does not smoke.  No personal nor family history of GI/colon cancer, inflammatory bowel disease, irritable bowel syndrome, allergy such as Celiac Sprue, dietary/dairy problems, colitis, ulcers nor gastritis. No recent sick contacts/gastroenteritis. No travel outside the country. No changes in diet. No dysphagia to solids or liquids. No significant heartburn or reflux. No hematochezia, hematemesis, coffee ground emesis. No evidence of prior gastric/peptic ulceration.  (Review of systems as stated in this history (HPI) or in the review of systems. Otherwise all other 12 point ROS are negative) ` ` `   Past Surgical History Maurilio Lovely; 10/04/2017 3:53 Harris) Cesarean Section - Multiple Colon Polyp Removal - Colonoscopy Oral Surgery Tonsillectomy  Diagnostic Studies History Maurilio Lovely; 10/04/2017 3:53 Harris) Colonoscopy 5-10 years ago Mammogram never Pap Smear 1-5 years ago  Allergies Maurilio Lovely; 10/04/2017 3:54 Harris) Cipro *Fluoroquinolones**  Medication History Maurilio Lovely; 10/04/2017 3:56 Harris) BuPROPion HCl (100MG  Tablet, Oral) Active. ValACYclovir HCl (500MG  Tablet, Oral) Active. Pantoprazole Sodium (20MG  Tablet DR, Oral) Active. QUEtiapine Fumarate ER (300MG  Tablet ER 24HR, Oral) Active. Ondansetron (4MG  Tablet Disint, Oral) Active. Pantoprazole Sodium (40MG  Tablet DR, Oral) Active. NuvaRing (0.12-0.015MG /24HR Ring, Vaginal) Active. Xanax (0.5MG  Tablet, Oral) Active. Medications Reconciled  Social History Maurilio Lovely; 10/04/2017 3:53 Harris) Alcohol use Occasional alcohol use. Caffeine use Coffee. No drug use Tobacco use Former smoker.  Family History Maurilio Lovely; 10/04/2017 3:53 Harris) Alcohol Abuse Brother, Father, Mother. Depression Brother, Mother.  Pregnancy / Birth History Maurilio Lovely; 10/04/2017 3:53 Harris) Age at menarche 10 years. Gravida 2 Irregular periods Maternal age 4-35 Para 3  Other Problems Maurilio Lovely; 10/04/2017 3:53  Harris) Anxiety Disorder Asthma Back Pain Cholelithiasis Gastroesophageal Reflux  Disease Kidney Stone     Review of Systems Maurilio Lovely; 10/04/2017 3:53 Harris) General Not Present- Appetite Loss, Chills, Fatigue, Fever, Night Sweats, Weight Gain and Weight Loss. Skin Not Present- Change in Wart/Mole, Dryness, Hives, Jaundice, New Lesions, Non-Healing Wounds, Rash and Ulcer. HEENT Present- Hearing Loss and Wears glasses/contact lenses. Not Present- Earache, Hoarseness, Nose Bleed, Oral Ulcers, Ringing in the Ears, Seasonal Allergies, Sinus Pain, Sore Throat, Visual Disturbances and Yellow Eyes. Respiratory Not Present- Bloody sputum, Chronic Cough, Difficulty Breathing, Snoring and Wheezing. Breast Not Present- Breast Mass, Breast Pain, Nipple Discharge and Skin Changes. Cardiovascular Not Present- Chest Pain, Difficulty Breathing Lying Down, Leg Cramps, Palpitations, Rapid Heart Rate, Shortness of Breath and Swelling of Extremities. Gastrointestinal Not Present- Abdominal Pain, Bloating, Bloody Stool, Change in Bowel Habits, Chronic diarrhea, Constipation, Difficulty Swallowing, Excessive gas, Gets full quickly at meals, Hemorrhoids, Indigestion, Nausea, Rectal Pain and Vomiting. Female Genitourinary Not Present- Frequency, Nocturia, Painful Urination, Pelvic Pain and Urgency. Musculoskeletal Not Present- Back Pain, Joint Pain, Joint Stiffness, Muscle Pain, Muscle Weakness and Swelling of Extremities. Neurological Not Present- Decreased Memory, Fainting, Headaches, Numbness, Seizures, Tingling, Tremor, Trouble walking and Weakness. Psychiatric Present- Anxiety and Bipolar. Not Present- Change in Sleep Pattern, Depression, Fearful and Frequent crying. Endocrine Not Present- Cold Intolerance, Excessive Hunger, Hair Changes, Heat Intolerance, Hot flashes and New Diabetes. Hematology Not Present- Blood Thinners, Easy Bruising, Excessive bleeding, Gland problems, HIV and Persistent  Infections.  Vitals Maurilio Lovely; 10/04/2017 3:57 Harris) 10/04/2017 3:56 Harris Weight: 153.38 lb Height: 63in Body Surface Area: 1.73 m Body Mass Index: 27.17 kg/m  Temp.: 98.36F(Oral)  Pulse: 79 (Regular)  BP: 128/72 (Sitting, Left Arm, Standard)      Physical Exam Tracy Sportsman Harris; 10/04/2017 4:30 Harris)  General Mental Status-Alert. General Appearance-Not in acute distress, Not Sickly. Orientation-Oriented X3. Hydration-Well hydrated. Voice-Normal.  Integumentary Global Assessment Upon inspection and palpation of skin surfaces of the - Axillae: non-tender, no inflammation or ulceration, no drainage. and Distribution of scalp and body hair is normal. General Characteristics Temperature - normal warmth is noted.  Head and Neck Head-normocephalic, atraumatic with no lesions or palpable masses. Face Global Assessment - atraumatic, no absence of expression. Neck Global Assessment - no abnormal movements, no bruit auscultated on the right, no bruit auscultated on the left, no decreased range of motion, non-tender. Trachea-midline. Thyroid Gland Characteristics - non-tender.  Eye Eyeball - Left-Extraocular movements intact, No Nystagmus. Eyeball - Right-Extraocular movements intact, No Nystagmus. Cornea - Left-No Hazy. Cornea - Right-No Hazy. Sclera/Conjunctiva - Left-No scleral icterus, No Discharge. Sclera/Conjunctiva - Right-No scleral icterus, No Discharge. Pupil - Left-Direct reaction to light normal. Pupil - Right-Direct reaction to light normal.  ENMT Ears Pinna - Left - no drainage observed, no generalized tenderness observed. Right - no drainage observed, no generalized tenderness observed. Nose and Sinuses External Inspection of the Nose - no destructive lesion observed. Inspection of the nares - Left - quiet respiration. Right - quiet respiration. Mouth and Throat Lips - Upper Lip - no fissures observed, no pallor  noted. Lower Lip - no fissures observed, no pallor noted. Nasopharynx - no discharge present. Oral Cavity/Oropharynx - Tongue - no dryness observed. Oral Mucosa - no cyanosis observed. Hypopharynx - no evidence of airway distress observed.  Chest and Lung Exam Inspection Movements - Normal and Symmetrical. Accessory muscles - No use of accessory muscles in breathing. Palpation Palpation of the chest reveals - Non-tender. Auscultation Breath sounds - Normal and Clear.  Cardiovascular Auscultation Rhythm - Regular.  Murmurs & Other Heart Sounds - Auscultation of the heart reveals - No Murmurs and No Systolic Clicks.  Abdomen Inspection Inspection of the abdomen reveals - No Visible peristalsis and No Abnormal pulsations. Umbilicus - No Bleeding, No Urine drainage. Palpation/Percussion Palpation and Percussion of the abdomen reveal - Soft, Non Tender, No Rebound tenderness, No Rigidity (guarding) and No Cutaneous hyperesthesia. Note: Abdomen soft. Not severely distended. No distasis recti. No umbilical or other anterior abdominal wall hernias  Female Genitourinary Sexual Maturity Tanner 5 - Adult hair pattern. Note: No vaginal bleeding nor discharge  Peripheral Vascular Upper Extremity Inspection - Left - No Cyanotic nailbeds, Not Ischemic. Right - No Cyanotic nailbeds, Not Ischemic.  Neurologic Neurologic evaluation reveals -normal attention span and ability to concentrate, able to name objects and repeat phrases. Appropriate fund of knowledge , normal sensation and normal coordination. Mental Status Affect - not angry, not paranoid. Cranial Nerves-Normal Bilaterally. Gait-Normal.  Neuropsychiatric Mental status exam performed with findings of-able to articulate well with normal speech/language, rate, volume and coherence, thought content normal with ability to perform basic computations and apply abstract reasoning and no evidence of hallucinations, delusions,  obsessions or homicidal/suicidal ideation.  Musculoskeletal Global Assessment Spine, Ribs and Pelvis - no instability, subluxation or laxity. Right Upper Extremity - no instability, subluxation or laxity.  Lymphatic Head & Neck  General Head & Neck Lymphatics: Bilateral - Description - No Localized lymphadenopathy. Axillary  General Axillary Region: Bilateral - Description - No Localized lymphadenopathy. Femoral & Inguinal  Generalized Femoral & Inguinal Lymphatics: Left - Description - No Localized lymphadenopathy. Right - Description - No Localized lymphadenopathy.    Assessment & Plan Tracy Harris(Tracy Harris)  CHRONIC CHOLECYSTITIS WITH CALCULUS (K80.10) Impression: Rather classic episode of biliary colic with right upper quadrant pain nausea and vomiting. Not consistent with heartburn and reflux or kidney stones. Not consistent with endometriosis. The rest of her discharge diagnosis seems unlikely. Gallstones on CAT scan.  Classic story biliary colic with a lack of anything else suspicious on differential diagnosis, I think she would benefit from cholecystectomy. She is leaning towards it but wants to think about things first.  Current Plans You are being scheduled for surgery- Our schedulers will call you.  You should hear from our office's scheduling department within 5 working days about the location, date, and time of surgery. We try to make accommodations for patient's preferences in scheduling surgery, but sometimes the OR schedule or the surgeon's schedule prevents us from making those accommodations.  If you have not heard from our office [phone]) in 5 working days, call the office and ask for your surgeon's nurse.  If you have other questions about your diagnosis, plan, or surgery, call the office and ask for your surgeon's nurse.  Written instructions provided Pt Education - Pamphlet Given - Laparoscopic Gallbladder Surgery: discussed with  patient and provided information. The anatomy & physiology of hepatobiliary & pancreatic function was discussed. The pathophysiology of gallbladder dysfunction was discussed. Natural history risks without surgery was discussed. I feel the risks of no intervention will lead to serious problems that outweigh the operative risks; therefore, I recommended cholecystectomy to remove the pathology. I explained laparoscopic techniques with possible need for an open approach. Probable cholangiogram to evaluate the bilary tract was explained as well.  Risks such as bleeding, infection, abscess, leak, injury to other organs, need for further treatment, heart attack, death, and other risks were discussed. I noted a good likelihood this will help  address the problem. Possibility that this will not correct all abdominal symptoms was explained. Goals of post-operative recovery were discussed as well. We will work to minimize complications. An educational handout further explaining the pathology and treatment options was given as well. Questions were answered. The patient expresses understanding & wishes to proceed with surgery.  Pt Education - [facility] Laparosopic Post Op HCI (Takila Kronberg) Pt Education - [facility] Good Bowel Health (Reise Hietala) Pt Education - Laparoscopic Cholecystectomy: gallbladder  Tracy SportsmanSteven C. Lacara Dunsworth, Harris, FACS, MASCRS Gastrointestinal and Minimally Invasive Surgery    [address] [place], [place] [id number] [phone] Main / Paging [phone] Fax

## 2018-01-07 ENCOUNTER — Other Ambulatory Visit: Payer: Self-pay | Admitting: Surgery

## 2019-02-05 ENCOUNTER — Ambulatory Visit (INDEPENDENT_AMBULATORY_CARE_PROVIDER_SITE_OTHER)
Admission: RE | Admit: 2019-02-05 | Discharge: 2019-02-05 | Disposition: A | Discharge status: Home / Self Care | Payer: BC Managed Care – PPO | Source: Ambulatory Visit

## 2019-02-05 ENCOUNTER — Ambulatory Visit (HOSPITAL_COMMUNITY)
Admission: EM | Admit: 2019-02-05 | Discharge: 2019-02-05 | Disposition: A | Discharge status: Home / Self Care | Payer: BC Managed Care – PPO | Attending: Emergency Medicine | Admitting: Emergency Medicine

## 2019-02-05 ENCOUNTER — Other Ambulatory Visit: Payer: Self-pay

## 2019-02-05 DIAGNOSIS — Z20822 Contact with and (suspected) exposure to covid-19: Secondary | ICD-10-CM

## 2019-02-05 DIAGNOSIS — Z20828 Contact with and (suspected) exposure to other viral communicable diseases: Secondary | ICD-10-CM | POA: Diagnosis not present

## 2019-02-05 DIAGNOSIS — R519 Headache, unspecified: Secondary | ICD-10-CM | POA: Diagnosis not present

## 2019-02-05 NOTE — Discharge Instructions (Addendum)
Continue with headache treatments as you have been doing.  Please present to one of our urgent care sites today for your covid-19 testing. When you check in state that you are there for a nurse visit following a video visit for covid-19 testing.  Self isolate until covid results are back and negative.  Will notify you by phone of any positive findings. Your negative results will be sent through your MyChart.     If any worsening of symptoms please be seen in person

## 2019-02-05 NOTE — ED Provider Notes (Signed)
Virtual Visit via Video Note:  Tracy Harris  initiated request for Telemedicine visit with [facility] team. I connected with Tracy Harris  on 02/05/2019 at 11:31 AM  for a synchronized telemedicine visit using a video enabled HIPPA compliant telemedicine application. I verified that I am speaking with Tracy Harris  using two identifiers. Georgetta Haber, NP  was physically located in a [facility] Urgent care site and Tracy Harris was located at a different location.   The limitations of evaluation and management by telemedicine as well as the availability of in-person appointments were discussed. Patient was informed that she  may incur a bill ( including co-pay) for this virtual visit encounter. Tracy Harris  expressed understanding and gave verbal consent to proceed with virtual visit.     History of Present Illness:Tracy Harris  is a 38 y.o. female presents with complaints of headache which started 12/10 which lasted about 24 hours. Yesterday with another headache which lasted 3 hours. She is a Runner, broadcasting/film/video, has been teaching in person. No specific known ill contacts. Feels like her throat has been raspy. Has had to take migraine medicine in the past, which typically helps quickly with her headache, which didn't help with her initial headache. Frontal headache initially, yesterday posterior headache. No nausea or vomiting. No dizziness or vision changes. Mild headache this morning, took tylenol which did help and now resolved. Excedrine migraine didn't help on 12/10. Has increased coffee and water intake as well. No fevers. No body aches. No cough congestion runny nose. No ear or stomach pain. Has been on a nuva ring continuously so does not have periods. Migraines are pretty infrequent for her, every 3 months approximately. Didn't have the typical light sensitivity or nausea which is typical of her usual migraines.    Past Medical History:   Diagnosis Date  . Anxiety   . Bipolar 1 disorder (HCC)   . Dichorionic diamniotic twin pregnancy in third trimester 05/02/2016  . Endometriosis   . GERD (gastroesophageal reflux disease)   . History of anorexia nervosa    hospitalized age 46 and 7  . History of kidney stones   . History of positive PPD   . Mild asthma   . PONV (postoperative nausea and vomiting)   . Postpartum care following cesarean delivery Indication: PROM / Di-Di Twins / Repeat (3/10) 05/02/2016  . Premature rupture of membranes 05/02/2016  . Previous cesarean delivery affecting pregnancy 05/02/2016  . Wears contact lenses     Allergies  Allergen Reactions  . Ciprofloxacin Nausea Only  . Other Swelling and Other (See Comments)    Pt is allergic to seprafilm medication used in abdomen during last laparoscopy surgery. Reaction:  Extreme abdominal swelling and pain        Observations/Objective: Alert, oriented, non toxic in appearance. Clear coherent speech without difficulty. No increased work of breathing visualized.  Ambulatory without difficulty.  Assessment and Plan: Atypical headaches. Concern for covid-19 with testing recommended since she works as a Runner, broadcasting/film/video. Recommended to present to UC today for Nurse Visit for PCR testing. Will notify of any positive findings and if any changes to treatment are needed.  In person precautions provided. Isolation discussed. Patient verbalized understanding and agreeable to plan.   Follow Up Instructions:    I discussed the assessment and treatment plan with the patient. The patient was provided an opportunity to ask questions and all were answered. The patient agreed with the plan  and demonstrated an understanding of the instructions.   The patient was advised to call back or seek an in-person evaluation if the symptoms worsen or if the condition fails to improve as anticipated.  I provided 15 minutes of non-face-to-face time during this encounter.    Zigmund Gottron, NP  02/05/2019 11:31 AM         Zigmund Gottron, NP 02/05/19 1151

## 2019-02-05 NOTE — ED Triage Notes (Signed)
Pt here for nurse visit covid testing only.

## 2019-02-06 LAB — NOVEL CORONAVIRUS, NAA (HOSP ORDER, SEND-OUT TO REF LAB; TAT 18-24 HRS): SARS-CoV-2, NAA: NOT DETECTED

## 2019-05-31 ENCOUNTER — Other Ambulatory Visit: Payer: Self-pay

## 2019-05-31 ENCOUNTER — Ambulatory Visit: Payer: BC Managed Care – PPO | Attending: Dermatology | Admitting: Physical Therapy

## 2019-05-31 ENCOUNTER — Encounter: Payer: Self-pay | Admitting: Physical Therapy

## 2019-05-31 DIAGNOSIS — M6281 Muscle weakness (generalized): Secondary | ICD-10-CM | POA: Diagnosis present

## 2019-05-31 DIAGNOSIS — N39498 Other specified urinary incontinence: Secondary | ICD-10-CM | POA: Diagnosis present

## 2019-05-31 DIAGNOSIS — R252 Cramp and spasm: Secondary | ICD-10-CM | POA: Diagnosis present

## 2019-05-31 DIAGNOSIS — R278 Other lack of coordination: Secondary | ICD-10-CM | POA: Diagnosis present

## 2019-05-31 NOTE — Patient Instructions (Addendum)
Moisturizers . They are used in the vagina to hydrate the mucous membrane that make up the vaginal canal. . Designed to keep a more normal acid balance (ph) . Once placed in the vagina, it will last between two to three days.  . Use 2-3 times per week at bedtime  . Ingredients to avoid is glycerin and fragrance, can increase chance of infection . Should not be used just before sex due to causing irritation . Most are gels administered either in a tampon-shaped applicator or as a vaginal suppository. They are non-hormonal.   Types of Moisturizers- internally  . Vitamin E vaginal suppositories- Whole foods, Amazon . Moist Again . Coconut oil- can break down condoms . Julva- (Do no use if on Tamoxifen) amazon . Yes moisturizer- amazon . NeuEve Silk , NeuEve Silver for menopausal or over 65 (if have severe vaginal atrophy or cancer treatments use NeuEve Silk for  1 month than move to [employer])- [employer], ShapeConsultant.com.cy . Olive and Bee intimate cream- www.oliveandbee.com.au . Mae vaginal moisturizer- Amazon . Aloe .    Creams to use externally on the Vulva area  Marathon Oil (good for for cancer patients that had radiation to the area)- [place] or [employer].https://garcia-valdez.org/  V-magic cream - amazon  Julva-amazon  Vital "V Wild Yam salve ( help moisturize and help with thinning vulvar area, does have Beeswax  MoodMaid Botanical Pro-Meno Wild Yam Cream- Amazon  Desert Harvest Gele  Cleo by Zane Herald labial moisturizer (Amazon,   Coconut or olive oil  aloe   Things to avoid in the vaginal area . Do not use things to irritate the vulvar area . No lotions just specialized creams for the vulva area- Neogyn, V-magic, No soaps; can use Aveeno or Calendula cleanser if needed. Must be gentle . No deodorants . No douches . Good to sleep without underwear to let the vaginal area to air out . No scrubbing: spread the lips to let warm water rinse over labias and pat dry [facility] [address] [place], [place] [address] Phone # [phone] Fax [phone]

## 2019-05-31 NOTE — Therapy (Signed)
[facility] [address], [place], [place], [address] Phone: [phone]   Fax:  [phone]  Physical Therapy Evaluation  Patient Details  Name: Tracy Harris MRN: [id number] Date of Birth: 04-26-1980 Referring Provider (PT): Gabriel Cirri, PA-C   Encounter Date: 05/31/2019  PT End of Session - 05/31/19 1726    Visit Number  1    Date for PT Re-Evaluation  08/23/19    Authorization Type  BCBS    PT Start Time  2094    PT Stop Time  1525    PT Time Calculation (min)  40 min    Activity Tolerance  Patient tolerated treatment well;No increased pain    Behavior During Therapy  WFL for tasks assessed/performed       Past Medical History:  Diagnosis Date  . Anxiety   . Bipolar 1 disorder ([place])   . Dichorionic diamniotic twin pregnancy in third trimester 05/02/2016  . Endometriosis   . GERD (gastroesophageal reflux disease)   . History of anorexia nervosa    hospitalized age 54 and 74  . History of kidney stones   . History of positive PPD   . Mild asthma   . PONV (postoperative nausea and vomiting)   . Postpartum care following cesarean delivery Indication: PROM / Di-Di Twins / Repeat (3/10) 05/02/2016  . Premature rupture of membranes 05/02/2016  . Previous cesarean delivery affecting pregnancy 05/02/2016  . Wears contact lenses     Past Surgical History:  Procedure Laterality Date  . CESAREAN SECTION  02/11/2011   Procedure: CESAREAN SECTION;  Surgeon: Marvene Staff, MD;  Location: [facility];  Service: Gynecology;  Laterality: N/A;  . CESAREAN SECTION MULTI-GESTATIONAL N/A 05/02/2016   Procedure: CESAREAN SECTION Multiple Gestation;  Surgeon: Servando Salina, MD;  Location: [facility];  Service: Obstetrics;  Laterality: N/A;  . CHOLECYSTECTOMY  2019  . DX  LAPAROSCOPY/  BX LEFT OVARIAN CYST/  ABLATION AND EXCISION ENDOMETRIOSIS/ LYSIS OF ADHESIONS  03-21-2010   and chromopurtubation  .  LAPAROSCOPY N/A 09/20/2013   Procedure: LAPAROSCOPY LYSIS OF ADHESIONS,  EXCISION OF ENDOMETRIOSIS RIGHT OVARIAN CYSTECTOMY.;  Surgeon: Governor Specking, MD;  Location: [facility];  Service: Gynecology;  Laterality: N/A;  . LASER ABLATION HPV , VAGINA, CERVIX, PERIANAL , AND VULVA  08-15-2002  . TONSILLECTOMY  2011    There were no vitals filed for this visit.   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 Since then has pain with intercourse. Pain with vaginal exams. I have lichens. Twins were C-section and her 41 year old.    Patient Stated Goals  reduce pain with penile penetration    Currently in Pain?  Yes    Pain Score  9     Pain Location  Vagina    Pain Orientation  Mid    Pain Descriptors / Indicators  Burning   ripping, tearing   Pain Type  Chronic pain    Pain Onset  More than a month ago    Pain Frequency  Intermittent    Aggravating Factors   during interocourse    Pain Relieving Factors  no intercourse    Multiple Pain Sites  No         [facility] PT Assessment - 05/31/19 0001      Assessment   Medical Diagnosis  Vulodynia    Referring Provider (PT)  Gabriel Cirri, PA-C    Prior Therapy  When she was pregnant and walking; therapy after she had her twins      Precautions   Precautions  None      Restrictions   Weight Bearing Restrictions  No      Balance Screen   Has the patient fallen in the past 6 months  No    Has the patient had a decrease in activity level because of a fear of falling?   No    Is the patient reluctant to leave their home because of a fear of falling?   No      Home Public house manager residence      Prior Function   Level of Independence  Independent    Vocation  Full time employment    Press photographer    Leisure  baking; standing      Posture/Postural Control   Posture/Postural Control  No significant limitations      ROM / Strength    AROM / PROM / Strength  AROM;PROM;Strength      AROM   Lumbar Flexion  full    Lumbar Extension  full     Lumbar - Right Side Bend  full    Lumbar - Left Side Bend  decreased by 25%    Lumbar - Right Rotation  decreased by 25%    Lumbar - Left Rotation  decreased by 75%      Strength   Right Hip Flexion  4/5    Right Hip Extension  3+/5    Right Hip ABduction  3+/5    Left Hip Flexion  4/5    Left Hip Extension  3+/5    Left Hip Internal Rotation  3+/5    Left Hip ABduction  3+/5    Left Hip ADduction  4/5      Palpation   Spinal mobility  L3-L5 decreased, T4-T8 decreased    SI assessment   left ilium is postiorly rotated and shallow    Palpation comment  decreased left rib bucket handle motion and left rib flare; decreased c-section scar mobility; tenderness located on right lower abdominal,    abdomen with overlap on c-section; bulge more on right               Objective measurements completed on examination: See above findings.    Pelvic Floor Special Questions - 05/31/19 0001    Prior Pregnancies  Yes    Number of Pregnancies  2   twins, single birth   Number of C-Sections  2    Diastasis Recti  2 finger widt above and below the umbilicus    Currently Sexually Active  Yes    Is this Painful  Yes    Marinoff Scale  pain prevents any attempts at intercourse    Urinary Leakage  Yes    How often  leaks at night and day    Pad use  day 1 pad; night 1 pad    Activities that cause leaking  With strong urge;Coughing;Sneezing;Laughing;Exercising   standling   Urinary urgency  Yes    Fecal incontinence  No    Falling out feeling (prolapse)  No    Skin Integrity  Erthema   posterior foruchette   External Palpation  8-4 and 1 O'clock    Pelvic Floor Internal Exam  Patient confirms identification and approves PT to assess pelvic floor and treatment    Exam Type  Vaginal    Palpation  tenderness located on the levator ani    Strength  Flicker                PT Education - 05/31/19 1725    Education Details  education on vaginal moisturizers    Person(s) Educated  Patient    Methods  Explanation;Demonstration;Handout    Comprehension  Verbalized understanding       PT Short Term Goals - 05/31/19 1736      PT SHORT TERM GOAL #1   Title  independent with initial HEP    Time  4    Period  Weeks    Status  New    Target Date  06/28/19      PT SHORT TERM GOAL #2   Title  able to perform perineal massage due to decreased tissue sensitivity    Time  4    Period  Weeks    Status  New    Target Date  06/28/19      PT SHORT TERM GOAL #3   Title  understand about vaginal lubricants and moisturizers to improve vaginal health    Time  4    Period  Weeks    Status  New    Target Date  06/28/19        PT Long Term Goals - 05/31/19 1738      PT LONG TERM GOAL #1   Title  independent with advanced HEP    Time  12    Period  Weeks    Status  New    Target Date  08/23/19      PT LONG TERM GOAL #2   Title  able to have penile penetrative intercourse with marinoff score 1/3 due to improve elongation of the tissue    Time  12    Period  Weeks    Status  New    Target Date  08/23/19      PT LONG TERM GOAL #3   Title  urinary leakage decreased >/= 75% due to improve pelvic floor strength >/= 3/5 for 10 seconds and bilateral hip strength >/= 4/5    Time  12    Period  Weeks    Status  New    Target Date  08/23/19      PT LONG TERM GOAL #4   Title  able to engage her core correctly with daily activities so she is able to perform her tasks with improved mobility    Time  12    Period  Weeks    Status  New    Target Date  08/23/19             Plan - 05/31/19 1728    Clinical Impression Statement  Patient is a 39 year old female 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 Patient reports her pain level is 8/10 with vaginal penetrative intercourse. Patient reported during her pregnancy she  had to walk with a walker due to bilateral hip pain. Patient has decreased lumbar ROM with pain. Patient left ilium is rotated posteriorly and forward. C-section scar has decreased fascial tightness. Patient has pain in right lower abdominal area. Decreased left bucket handle motion of rib cage. Decreased movement of T5-T8and L3-L5. diastasis is 2 fingers width above and below the umbilicus. Patient will leak urine at night, with strong urge, exercise, laughing, and sneezing. Marinoff score is 3/3 and not able to have intercourse due to pain. Pelvic floor strength 1/5.  Tenderness located on the posterior fourchette and left levator ani. Q-tip test with tenderness located at 4-8 and 1 O'clock. Patient will benefit from skilled therapy to reduce pain, improve tissue elongation and improve strength.    Personal Factors and Comorbidities  Comorbidity 2;Time since onset of injury/illness/exacerbation;Sex    Comorbidities  c-section 2x (1 time with twins); endometriosis; Laproscopic surgery 2x    Examination-Activity Limitations  Continence;Toileting    Examination-Participation Restrictions  Interpersonal Relationship    Stability/Clinical Decision Making  Evolving/Moderate complexity    Clinical Decision Making  Moderate    Rehab Potential  Good    PT Frequency  1x / week    PT Duration  12 weeks    PT Treatment/Interventions  Biofeedback;Cryotherapy;Electrical Stimulation;Moist Heat;Ultrasound;Neuromuscular re-education;Therapeutic exercise;Therapeutic activities;Patient/family education;Manual techniques;Dry needling;Scar mobilization;Spinal Manipulations    PT Next Visit Plan  correct ilium, spinal mobilization; abdominal massage; c-section scar; fascial release around the vulvar and bil. thighs, educated on bulging the pelvic floor and gentle perinel massage    Consulted and Agree with Plan of Care  Patient       Patient will benefit from skilled therapeutic intervention in order to improve the  following deficits and impairments:  Decreased coordination, Decreased range of motion, Increased fascial restricitons, Pain, Decreased activity tolerance, Decreased strength, Decreased mobility, Decreased scar mobility  Visit Diagnosis: Muscle weakness (generalized) - Plan: PT plan of care cert/re-cert  Cramp and spasm - Plan: PT plan of care cert/re-cert  Other urinary incontinence - Plan: PT plan of care cert/re-cert  Other lack of coordination - Plan: PT plan of care cert/re-cert     Problem List Patient Active Problem List   Diagnosis Date Noted  . Premature rupture of membranes 05/02/2016  . Dichorionic diamniotic twin pregnancy in third trimester 05/02/2016  . Previous cesarean delivery affecting pregnancy 05/02/2016  . Postpartum care s/p repeat c-section: twins (3/10) 05/02/2016    Eulis Foster, PT 05/31/19 5:44 PM   [facility] [address] [place], [place], [address] Phone: [phone]   Fax:  [phone]  Name: Tracy Harris MRN: [id number] Date of Birth: [birth year]

## 2019-06-01 DIAGNOSIS — A6009 Herpesviral infection of other urogenital tract: Secondary | ICD-10-CM | POA: Insufficient documentation

## 2019-06-01 DIAGNOSIS — K219 Gastro-esophageal reflux disease without esophagitis: Secondary | ICD-10-CM | POA: Insufficient documentation

## 2019-06-01 DIAGNOSIS — J4520 Mild intermittent asthma, uncomplicated: Secondary | ICD-10-CM | POA: Insufficient documentation

## 2019-07-10 ENCOUNTER — Encounter: Payer: Self-pay | Admitting: Physical Therapy

## 2019-07-10 ENCOUNTER — Other Ambulatory Visit: Payer: Self-pay

## 2019-07-10 ENCOUNTER — Ambulatory Visit: Payer: BC Managed Care – PPO | Attending: Dermatology | Admitting: Physical Therapy

## 2019-07-10 DIAGNOSIS — R278 Other lack of coordination: Secondary | ICD-10-CM | POA: Insufficient documentation

## 2019-07-10 DIAGNOSIS — N39498 Other specified urinary incontinence: Secondary | ICD-10-CM | POA: Insufficient documentation

## 2019-07-10 DIAGNOSIS — R252 Cramp and spasm: Secondary | ICD-10-CM | POA: Insufficient documentation

## 2019-07-10 DIAGNOSIS — M6281 Muscle weakness (generalized): Secondary | ICD-10-CM | POA: Diagnosis present

## 2019-07-10 NOTE — Therapy (Signed)
[facility] [address], [address] [place], [place], [address] Phone: [phone]   Fax:  [phone]  Physical Therapy Treatment  Patient Details  Name: Tracy Harris MRN: [id number] Date of Birth: 1980-03-21 Referring Provider (PT): Rada Hay, PA-C   Encounter Date: 07/10/2019  PT End of Session - 07/10/19 1450    Visit Number  2    Date for PT Re-Evaluation  08/23/19    Authorization Type  BCBS    PT Start Time  1445    PT Stop Time  1525    PT Time Calculation (min)  40 min    Activity Tolerance  Patient tolerated treatment well;No increased pain    Behavior During Therapy  WFL for tasks assessed/performed       Past Medical History:  Diagnosis Date  . Anxiety   . Bipolar 1 disorder ([facility])   . Dichorionic diamniotic twin pregnancy in third trimester 05/02/2016  . Endometriosis   . GERD (gastroesophageal reflux disease)   . History of anorexia nervosa    hospitalized age 50 and 61  . History of kidney stones   . History of positive PPD   . Mild asthma   . PONV (postoperative nausea and vomiting)   . Postpartum care following cesarean delivery Indication: PROM / Di-Di Twins / Repeat (3/10) 05/02/2016  . Premature rupture of membranes 05/02/2016  . Previous cesarean delivery affecting pregnancy 05/02/2016  . Wears contact lenses     Past Surgical History:  Procedure Laterality Date  . CESAREAN SECTION  02/11/2011   Procedure: CESAREAN SECTION;  Surgeon: Serita Kyle, MD;  Location: [facility];  Service: Gynecology;  Laterality: N/A;  . CESAREAN SECTION MULTI-GESTATIONAL N/A 05/02/2016   Procedure: CESAREAN SECTION Multiple Gestation;  Surgeon: Maxie Better, MD;  Location: [facility];  Service: Obstetrics;  Laterality: N/A;  . CHOLECYSTECTOMY  2019  . DX  LAPAROSCOPY/  BX LEFT OVARIAN CYST/  ABLATION AND EXCISION ENDOMETRIOSIS/ LYSIS OF ADHESIONS  03-21-2010   and chromopurtubation  .  LAPAROSCOPY N/A 09/20/2013   Procedure: LAPAROSCOPY LYSIS OF ADHESIONS,  EXCISION OF ENDOMETRIOSIS RIGHT OVARIAN CYSTECTOMY.;  Surgeon: Fermin Schwab, MD;  Location: [facility];  Service: Gynecology;  Laterality: N/A;  . LASER ABLATION HPV , VAGINA, CERVIX, PERIANAL , AND VULVA  08-15-2002  . TONSILLECTOMY  2011    There were no vitals filed for this visit.  Subjective Assessment - 07/10/19 1448    Subjective  I had a flare-up with my back several days ago and was not able to go to work. I went to [place] and got a cortisone shot.    Patient Stated Goals  reduce pain with penile penetration    Currently in Pain?  Yes    Pain Score  9     Pain Location  Vagina    Pain Orientation  Mid    Pain Descriptors / Indicators  Burning   ripping, tearing   Pain Type  Chronic pain    Pain Onset  More than a month ago    Pain Frequency  Intermittent    Aggravating Factors   during intercourse    Pain Relieving Factors  no intercourse    Multiple Pain Sites  No                        [facility] Adult PT Treatment/Exercise - 07/10/19 0001      Lumbar Exercises: Supine  Other Supine Lumbar Exercises  supine 90/90 position with breath to open the rib cage and contract the inner thigh for lower abdominal contraction    Other Supine Lumbar Exercises  360 breathing to expand the lower rib cage and then the abdomen to relax the pelvic floor due to tightness      Manual Therapy   Manual Therapy  Muscle Energy Technique;Soft tissue mobilization    Manual therapy comments  education on c-section scar massage    Joint Mobilization  sacral mobilization to correct right rotation; PA and rotational mobilization to T4-L5    Soft tissue mobilization  scar massage to c-section scar to improve mobility; using the roller to massage and relax her thighs, education on using a roller at home    Muscle Energy Technique  muscle energy to correct  right ilium             PT Education  - 07/10/19 1619    Education Details  90/90 breathing with feet on wall; breathing to open up the pelvic floor; c-section massage    Person(s) Educated  Patient    Methods  Explanation;Demonstration;Verbal cues;Handout    Comprehension  Verbalized understanding;Returned demonstration       PT Short Term Goals - 05/31/19 1736      PT SHORT TERM GOAL #1   Title  independent with initial HEP    Time  4    Period  Weeks    Status  New    Target Date  06/28/19      PT SHORT TERM GOAL #2   Title  able to perform perineal massage due to decreased tissue sensitivity    Time  4    Period  Weeks    Status  New    Target Date  06/28/19      PT SHORT TERM GOAL #3   Title  understand about vaginal lubricants and moisturizers to improve vaginal health    Time  4    Period  Weeks    Status  New    Target Date  06/28/19        PT Long Term Goals - 05/31/19 1738      PT LONG TERM GOAL #1   Title  independent with advanced HEP    Time  12    Period  Weeks    Status  New    Target Date  08/23/19      PT LONG TERM GOAL #2   Title  able to have penile penetrative intercourse with marinoff score 1/3 due to improve elongation of the tissue    Time  12    Period  Weeks    Status  New    Target Date  08/23/19      PT LONG TERM GOAL #3   Title  urinary leakage decreased >/= 75% due to improve pelvic floor strength >/= 3/5 for 10 seconds and bilateral hip strength >/= 4/5    Time  12    Period  Weeks    Status  New    Target Date  08/23/19      PT LONG TERM GOAL #4   Title  able to engage her core correctly with daily activities so she is able to perform her tasks with improved mobility    Time  12    Period  Weeks    Status  New    Target Date  08/23/19  Plan - 07/10/19 1451    Clinical Impression Statement  Patient has fascial tightness in the lower abdomen especially on the left side. Patient has tightness in the thoracic spine. After manual work the pelvis  was in the correct alignment. Patient has difficulty with relaxing the inner thighs and her hip adductors were like a band. After therapy, patient was not complaining of back pain. Patient will benefit from skilled therapy to reduce pain, improve tissue elongation and improve strength.    Personal Factors and Comorbidities  Comorbidity 2;Time since onset of injury/illness/exacerbation;Sex    Comorbidities  c-section 2x (1 time with twins); endometriosis; Laproscopic surgery 2x    Examination-Activity Limitations  Continence;Toileting    Examination-Participation Restrictions  Interpersonal Relationship    Stability/Clinical Decision Making  Evolving/Moderate complexity    Rehab Potential  Good    PT Frequency  1x / week    PT Duration  12 weeks    PT Treatment/Interventions  Biofeedback;Cryotherapy;Electrical Stimulation;Moist Heat;Ultrasound;Neuromuscular re-education;Therapeutic exercise;Therapeutic activities;Patient/family education;Manual techniques;Dry needling;Scar mobilization;Spinal Manipulations    PT Next Visit Plan  check  ilium alignment,  spinal mobilization; abdominal massage; c-section scar; fascial release around the vulvar and bil. thighs,  and gentle perinel massage; see how contract the abdominal; education on vaginal lubricants and moisturizers    Recommended Other Services  MD signed initial eval    Consulted and Agree with Plan of Care  Patient       Patient will benefit from skilled therapeutic intervention in order to improve the following deficits and impairments:     Visit Diagnosis: Muscle weakness (generalized)  Cramp and spasm  Other urinary incontinence  Other lack of coordination     Problem List Patient Active Problem List   Diagnosis Date Noted  . Premature rupture of membranes 05/02/2016  . Dichorionic diamniotic twin pregnancy in third trimester 05/02/2016  . Previous cesarean delivery affecting pregnancy 05/02/2016  . Postpartum care s/p repeat  c-section: twins (3/10) 05/02/2016    Earlie Counts, PT 07/10/19 4:25 PM   [facility] [address], [place], [place], [address] Phone: [phone]   Fax:  [phone]  Name: Tracy Harris MRN: [id number] Date of Birth: 09-Jun-1980

## 2019-07-17 ENCOUNTER — Encounter: Payer: Self-pay | Admitting: Physical Therapy

## 2019-07-17 ENCOUNTER — Ambulatory Visit: Payer: BC Managed Care – PPO | Admitting: Physical Therapy

## 2019-07-17 ENCOUNTER — Other Ambulatory Visit: Payer: Self-pay

## 2019-07-17 DIAGNOSIS — R278 Other lack of coordination: Secondary | ICD-10-CM

## 2019-07-17 DIAGNOSIS — N39498 Other specified urinary incontinence: Secondary | ICD-10-CM

## 2019-07-17 DIAGNOSIS — M6281 Muscle weakness (generalized): Secondary | ICD-10-CM | POA: Diagnosis not present

## 2019-07-17 DIAGNOSIS — R252 Cramp and spasm: Secondary | ICD-10-CM

## 2019-07-17 NOTE — Therapy (Signed)
[facility] [address] [place], [place], [address] Phone: [phone]   Fax:  [phone]  Physical Therapy Treatment  Patient Details  Name: Tracy Harris MRN: [id number] Date of Birth: 06/09/1980 Referring Provider (PT): Rada Hay, PA-C   Encounter Date: 07/17/2019  PT End of Session - 07/17/19 1528    Visit Number  3    Date for PT Re-Evaluation  08/23/19    Authorization Type  BCBS    PT Start Time  1445    PT Stop Time  1525    PT Time Calculation (min)  40 min    Activity Tolerance  Patient tolerated treatment well;No increased pain    Behavior During Therapy  WFL for tasks assessed/performed       Past Medical History:  Diagnosis Date  . Anxiety   . Bipolar 1 disorder (HCC)   . Dichorionic diamniotic twin pregnancy in third trimester 05/02/2016  . Endometriosis   . GERD (gastroesophageal reflux disease)   . History of anorexia nervosa    hospitalized age 29 and 78  . History of kidney stones   . History of positive PPD   . Mild asthma   . PONV (postoperative nausea and vomiting)   . Postpartum care following cesarean delivery Indication: PROM / Di-Di Twins / Repeat (3/10) 05/02/2016  . Premature rupture of membranes 05/02/2016  . Previous cesarean delivery affecting pregnancy 05/02/2016  . Wears contact lenses     Past Surgical History:  Procedure Laterality Date  . CESAREAN SECTION  02/11/2011   Procedure: CESAREAN SECTION;  Surgeon: Serita Kyle, MD;  Location: [facility];  Service: Gynecology;  Laterality: N/A;  . CESAREAN SECTION MULTI-GESTATIONAL N/A 05/02/2016   Procedure: CESAREAN SECTION Multiple Gestation;  Surgeon: Maxie Better, MD;  Location: [facility];  Service: Obstetrics;  Laterality: N/A;  . CHOLECYSTECTOMY  2019  . DX  LAPAROSCOPY/  BX LEFT OVARIAN CYST/  ABLATION AND EXCISION ENDOMETRIOSIS/ LYSIS OF ADHESIONS  03-21-2010   and chromopurtubation  .  LAPAROSCOPY N/A 09/20/2013   Procedure: LAPAROSCOPY LYSIS OF ADHESIONS,  EXCISION OF ENDOMETRIOSIS RIGHT OVARIAN CYSTECTOMY.;  Surgeon: Fermin Schwab, MD;  Location: [facility];  Service: Gynecology;  Laterality: N/A;  . LASER ABLATION HPV , VAGINA, CERVIX, PERIANAL , AND VULVA  08-15-2002  . TONSILLECTOMY  2011    There were no vitals filed for this visit.  Subjective Assessment - 07/17/19 1449    Subjective  My back felt alot better from the treatment. I have no pain for 2 days in my back. I feel a little off in my pelvis.    Patient Stated Goals  reduce pain with penile penetration    Currently in Pain?  Yes    Pain Score  9     Pain Location  Vagina    Pain Orientation  Mid    Pain Descriptors / Indicators  Burning   ripping, tearing   Pain Type  Chronic pain    Pain Onset  More than a month ago    Pain Frequency  Intermittent    Aggravating Factors   during intercourse    Pain Relieving Factors  no intercourse    Multiple Pain Sites  No         [facility] PT Assessment - 07/17/19 0001      Palpation   SI assessment   pelvis in correct alignment  Pelvic Floor Special Questions - 07/17/19 0001    Pelvic Floor Internal Exam  Patient confirms identification and approves PT to assess pelvic floor and treatment    Exam Type  Vaginal        [facility] Adult PT Treatment/Exercise - 07/17/19 0001      Manual Therapy   Manual Therapy  Myofascial release;Soft tissue mobilization    Manual therapy comments  educated patient on how to perform manual techniques along the clitoral hood, labia majora, and how to perfrom internal massage    Soft tissue mobilization  scar mobilization to lower abdomen to release the restrictions    Myofascial Release  release of the lower abdominal area, alng the lateral obliques, left sidey releasing the righ tdiaphgram, right obilques; release around the clitorus and the clitoral hood    Muscle Energy Technique   release along the bulbocavernosus, and internal sphincter with stroking method             PT Education - 07/17/19 1524    Education Details  instruction on perineal massage and education on vaginal moisturizers    Person(s) Educated  Patient    Methods  Explanation;Demonstration;Verbal cues;Handout    Comprehension  Verbalized understanding;Returned demonstration       PT Short Term Goals - 07/17/19 1532      PT SHORT TERM GOAL #1   Title  independent with initial HEP    Period  Weeks    Status  Achieved      PT SHORT TERM GOAL #2   Title  able to perform perineal massage due to decreased tissue sensitivity    Baseline  just educated    Time  4    Period  Weeks    Status  On-going      PT SHORT TERM GOAL #3   Title  understand about vaginal lubricants and moisturizers to improve vaginal health    Time  4    Period  Weeks    Status  On-going        PT Long Term Goals - 05/31/19 1738      PT LONG TERM GOAL #1   Title  independent with advanced HEP    Time  12    Period  Weeks    Status  New    Target Date  08/23/19      PT LONG TERM GOAL #2   Title  able to have penile penetrative intercourse with marinoff score 1/3 due to improve elongation of the tissue    Time  12    Period  Weeks    Status  New    Target Date  08/23/19      PT LONG TERM GOAL #3   Title  urinary leakage decreased >/= 75% due to improve pelvic floor strength >/= 3/5 for 10 seconds and bilateral hip strength >/= 4/5    Time  12    Period  Weeks    Status  New    Target Date  08/23/19      PT LONG TERM GOAL #4   Title  able to engage her core correctly with daily activities so she is able to perform her tasks with improved mobility    Time  12    Period  Weeks    Status  New    Target Date  08/23/19            Plan - 07/17/19 1529    Clinical Impression Statement  Patient is too scared to have intercourse due to pain. Patient was instructed on vaginal moisturizers and how  to perform fascial work to the vulva and clitoral hood. Patient continues to have tightness in the inner thigh. Patient has improved back pain. Patient pelvis in correct alignment. Patient has improved fascial movement along the scar and abdomen after manual work. Patient has vaginal dryness and atrophy. Patient will benefit from skilled therapy to reduce pain and improve tissue elongation and improve strength.    Personal Factors and Comorbidities  Comorbidity 2;Time since onset of injury/illness/exacerbation;Sex    Comorbidities  c-section 2x (1 time with twins); endometriosis; Laproscopic surgery 2x    Examination-Activity Limitations  Continence;Toileting    Examination-Participation Restrictions  Interpersonal Relationship    Stability/Clinical Decision Making  Evolving/Moderate complexity    Rehab Potential  Good    PT Frequency  1x / week    PT Duration  12 weeks    PT Treatment/Interventions  Biofeedback;Cryotherapy;Electrical Stimulation;Moist Heat;Ultrasound;Neuromuscular re-education;Therapeutic exercise;Therapeutic activities;Patient/family education;Manual techniques;Dry needling;Scar mobilization;Spinal Manipulations    PT Next Visit Plan  vulvar fascial release, contraction of the abdomen and core stability    Consulted and Agree with Plan of Care  Patient       Patient will benefit from skilled therapeutic intervention in order to improve the following deficits and impairments:  Decreased coordination, Decreased range of motion, Increased fascial restricitons, Pain, Decreased activity tolerance, Decreased strength, Decreased mobility, Decreased scar mobility  Visit Diagnosis: Muscle weakness (generalized)  Cramp and spasm  Other urinary incontinence  Other lack of coordination     Problem List Patient Active Problem List   Diagnosis Date Noted  . Premature rupture of membranes 05/02/2016  . Dichorionic diamniotic twin pregnancy in third trimester 05/02/2016  .  Previous cesarean delivery affecting pregnancy 05/02/2016  . Postpartum care s/p repeat c-section: twins (3/10) 05/02/2016    Earlie Counts, PT 07/17/19 3:33 PM   [facility] [address], [place], [place], [address] Phone: [phone]   Fax:  [phone]  Name: Tracy Harris MRN: [id number] Date of Birth: [birth year]

## 2019-07-17 NOTE — Patient Instructions (Signed)
Moisturizers . They are used in the vagina to hydrate the mucous membrane that make up the vaginal canal. . Designed to keep a more normal acid balance (ph) . Once placed in the vagina, it will last between two to three days.  . Use 2-3 times per week at bedtime  . Ingredients to avoid is glycerin and fragrance, can increase chance of infection . Should not be used just before sex due to causing irritation . Most are gels administered either in a tampon-shaped applicator or as a vaginal suppository. They are non-hormonal.   Types of Moisturizers  . Vitamin E vaginal suppositories- Whole foods, Amazon . Moist Again . Coconut oil- can break down condoms . Julva- (Do no use if on Tamoxifen) amazon . Yes moisturizer- amazon . NeuEve Silk , NeuEve Silver for menopausal or over 65 (if have severe vaginal atrophy or cancer treatments use NeuEve Silk for  1 month than move to NeuEve Silver)- Amazon, Neuve.com . Olive and Bee intimate cream- www.oliveandbee.com.au . Mae vaginal moisturizer- Amazon . Aloe .    Creams to use externally on the Vulva area  Desert Harvest Releveum (good for for cancer patients that had radiation to the area)- amazon or www.desertharvest.com  V-magic cream - amazon  Julva-amazon  Vital "V Wild Yam salve ( help moisturize and help with thinning vulvar area, does have Beeswax  MoodMaid Botanical Pro-Meno Wild Yam Cream- Amazon  Desert Harvest Gele  Cleo by Damiva labial moisturizer (Amazon,   Coconut or olive oil  aloe   Things to avoid in the vaginal area . Do not use things to irritate the vulvar area . No lotions just specialized creams for the vulva area- Neogyn, V-magic, No soaps; can use Aveeno or Calendula cleanser if needed. Must be gentle . No deodorants . No douches . Good to sleep without underwear to let the vaginal area to air out . No scrubbing: spread the lips to let warm water rinse over labias and pat dry  [facility] [address], [address] [place], [place] [address] Phone # [phone] Fax [phone]   

## 2019-08-02 ENCOUNTER — Other Ambulatory Visit: Payer: Self-pay

## 2019-08-02 ENCOUNTER — Ambulatory Visit: Payer: BC Managed Care – PPO | Attending: Dermatology | Admitting: Physical Therapy

## 2019-08-02 ENCOUNTER — Encounter: Payer: Self-pay | Admitting: Physical Therapy

## 2019-08-02 DIAGNOSIS — R278 Other lack of coordination: Secondary | ICD-10-CM | POA: Diagnosis present

## 2019-08-02 DIAGNOSIS — N39498 Other specified urinary incontinence: Secondary | ICD-10-CM | POA: Diagnosis present

## 2019-08-02 DIAGNOSIS — R252 Cramp and spasm: Secondary | ICD-10-CM | POA: Diagnosis present

## 2019-08-02 DIAGNOSIS — M6281 Muscle weakness (generalized): Secondary | ICD-10-CM

## 2019-08-02 NOTE — Therapy (Signed)
[facility] [address] [place], [place], [address] Phone: [phone]   Fax:  [phone]  Physical Therapy Treatment  Patient Details  Name: Tracy Harris MRN: [id number] Date of Birth: Nov 12, 1980 Referring Provider (PT): Gabriel Cirri, PA-C   Encounter Date: 08/02/2019  PT End of Session - 08/02/19 1723    Visit Number  4    Date for PT Re-Evaluation  08/23/19    Authorization Type  BCBS    PT Start Time  1625    PT Stop Time  1710    PT Time Calculation (min)  45 min    Activity Tolerance  Patient tolerated treatment well;No increased pain    Behavior During Therapy  WFL for tasks assessed/performed       Past Medical History:  Diagnosis Date  . Anxiety   . Bipolar 1 disorder ([facility])   . Dichorionic diamniotic twin pregnancy in third trimester 05/02/2016  . Endometriosis   . GERD (gastroesophageal reflux disease)   . History of anorexia nervosa    hospitalized age 34 and 70  . History of kidney stones   . History of positive PPD   . Mild asthma   . PONV (postoperative nausea and vomiting)   . Postpartum care following cesarean delivery Indication: PROM / Di-Di Twins / Repeat (3/10) 05/02/2016  . Premature rupture of membranes 05/02/2016  . Previous cesarean delivery affecting pregnancy 05/02/2016  . Wears contact lenses     Past Surgical History:  Procedure Laterality Date  . CESAREAN SECTION  02/11/2011   Procedure: CESAREAN SECTION;  Surgeon: Marvene Staff, MD;  Location: [facility];  Service: Gynecology;  Laterality: N/A;  . CESAREAN SECTION MULTI-GESTATIONAL N/A 05/02/2016   Procedure: CESAREAN SECTION Multiple Gestation;  Surgeon: Servando Salina, MD;  Location: [facility];  Service: Obstetrics;  Laterality: N/A;  . CHOLECYSTECTOMY  2019  . DX  LAPAROSCOPY/  BX LEFT OVARIAN CYST/  ABLATION AND EXCISION ENDOMETRIOSIS/ LYSIS OF ADHESIONS  03-21-2010   and chromopurtubation  .  LAPAROSCOPY N/A 09/20/2013   Procedure: LAPAROSCOPY LYSIS OF ADHESIONS,  EXCISION OF ENDOMETRIOSIS RIGHT OVARIAN CYSTECTOMY.;  Surgeon: Governor Specking, MD;  Location: [facility];  Service: Gynecology;  Laterality: N/A;  . LASER ABLATION HPV , VAGINA, CERVIX, PERIANAL , AND VULVA  08-15-2002  . TONSILLECTOMY  2011    There were no vitals filed for this visit.  Subjective Assessment - 08/02/19 1624    Subjective  When I am at the pool the vaginal area gets dryer.    Patient Stated Goals  reduce pain with penile penetration    Currently in Pain?  Yes    Pain Score  7     Pain Location  Vagina    Pain Orientation  Mid    Pain Descriptors / Indicators  Burning    Pain Type  Chronic pain    Pain Onset  More than a month ago    Pain Frequency  Intermittent    Aggravating Factors   during intercourse    Pain Relieving Factors  no intercourse    Multiple Pain Sites  No                     Pelvic Floor Special Questions - 08/02/19 0001    Diastasis Recti  1 finger above and 2 finger below     External Palpation  placed desert harvest reveleum to the vulvar area    Pelvic  Floor Internal Exam  Patient confirms identification and approves PT to assess pelvic floor and treatment    Exam Type  Vaginal    Palpation  tenderness in left ischiocavernosus, bil. levator ani, left side of the bladder     Strength  weak squeeze, no lift        [facility] Adult PT Treatment/Exercise - 08/02/19 0001      Self-Care   Self-Care  Other Self-Care Comments    Other Self-Care Comments   discussed with patient on device she can use so her husband does not penetrate all the way into the vagina; how to clean the area after being in the pool, using a cream regularlly      Lumbar Exercises: Supine   Ab Set  10 reps;5 seconds    AB Set Limitations  with kinesiotape and tactile cues to help fascilitate the lower abdominals to contract      Manual Therapy   Manual Therapy  Internal  Pelvic Floor;Soft tissue mobilization;Myofascial release;Taping    Soft tissue mobilization  along the ischiocavernosus on the left, levator ani externally with hip motion    Myofascial Release  release around the labial minora, vulvar area    Internal Pelvic Floor  left obturator internist with hip motion, left side of the bladder with fascial release    Kinesiotex  Facilitate Muscle      Kinesiotix   Facilitate Muscle   facilitate the transverse abdominus              PT Education - 08/02/19 1723    Education Details  using Desert Harvest Reveleum to the vulvar area, contracting the abdominals with the kinesiotape    Person(s) Educated  Patient    Methods  Explanation;Demonstration    Comprehension  Verbalized understanding       PT Short Term Goals - 08/02/19 1727      PT SHORT TERM GOAL #1   Title  independent with initial HEP    Time  4    Period  Weeks    Status  Achieved      PT SHORT TERM GOAL #2   Title  able to perform perineal massage due to decreased tissue sensitivity    Time  4    Period  Weeks    Status  Achieved      PT SHORT TERM GOAL #3   Title  understand about vaginal lubricants and moisturizers to improve vaginal health    Time  4    Period  Weeks    Status  Achieved        PT Long Term Goals - 08/02/19 1629      PT LONG TERM GOAL #1   Title  independent with advanced HEP    Time  12    Period  Weeks    Status  On-going      PT LONG TERM GOAL #2   Title  able to have penile penetrative intercourse with marinoff score 1/3 due to improve elongation of the tissue    Time  12    Period  Weeks    Status  On-going      PT LONG TERM GOAL #3   Title  urinary leakage decreased >/= 75% due to improve pelvic floor strength >/= 3/5 for 10 seconds and bilateral hip strength >/= 4/5    Baseline  decreased by 30%    Time  12    Period  Weeks    Status  On-going      PT LONG TERM GOAL #4   Title  able to engage her core correctly with daily  activities so she is able to perform her tasks with improved mobility    Time  12    Period  Weeks    Status  On-going            Plan - 08/02/19 1724    Clinical Impression Statement  Pelvic floor strength is 2/5. Patient is able to contract her abdominal correctly with the kinesiotape. Patient has no burning pain since the [facility] harvest Reveleum. Patient had increased hip and pelvic motion after therapy. Patient reports he urinary leakage is 35% better. Patient will benefit from skilled therapy to reduce pain and improve tissue elongation and improve strength.    Personal Factors and Comorbidities  Comorbidity 2;Time since onset of injury/illness/exacerbation;Sex    Comorbidities  c-section 2x (1 time with twins); endometriosis; Laproscopic surgery 2x    Examination-Activity Limitations  Continence;Toileting    Examination-Participation Restrictions  Interpersonal Relationship    Stability/Clinical Decision Making  Evolving/Moderate complexity    Rehab Potential  Good    PT Frequency  1x / week    PT Duration  12 weeks    PT Treatment/Interventions  Biofeedback;Cryotherapy;Electrical Stimulation;Moist Heat;Ultrasound;Neuromuscular re-education;Therapeutic exercise;Therapeutic activities;Patient/family education;Manual techniques;Dry needling;Scar mobilization;Spinal Manipulations    PT Next Visit Plan  work on abdominal contraction with arm and leg movement; fascial release to the pelvic area and along the bladder, exercises in the pool for core    Consulted and Agree with Plan of Care  Patient       Patient will benefit from skilled therapeutic intervention in order to improve the following deficits and impairments:  Decreased coordination, Decreased range of motion, Increased fascial restricitons, Pain, Decreased activity tolerance, Decreased strength, Decreased mobility, Decreased scar mobility  Visit Diagnosis: Muscle weakness (generalized)  Cramp and spasm  Other urinary  incontinence  Other lack of coordination     Problem List Patient Active Problem List   Diagnosis Date Noted  . Premature rupture of membranes 05/02/2016  . Dichorionic diamniotic twin pregnancy in third trimester 05/02/2016  . Previous cesarean delivery affecting pregnancy 05/02/2016  . Postpartum care s/p repeat c-section: twins (3/10) 05/02/2016    Eulis Foster, PT 08/02/19 5:29 PM   [facility] [address], [address] [place], [place], [address] Phone: [phone]   Fax:  [phone]  Name: Tracy Harris MRN: [id number] Date of Birth: 1980/08/05

## 2019-08-09 ENCOUNTER — Other Ambulatory Visit: Payer: Self-pay

## 2019-08-09 ENCOUNTER — Encounter: Payer: Self-pay | Admitting: Physical Therapy

## 2019-08-09 ENCOUNTER — Ambulatory Visit: Payer: BC Managed Care – PPO | Admitting: Physical Therapy

## 2019-08-09 DIAGNOSIS — R252 Cramp and spasm: Secondary | ICD-10-CM

## 2019-08-09 DIAGNOSIS — N39498 Other specified urinary incontinence: Secondary | ICD-10-CM

## 2019-08-09 DIAGNOSIS — M6281 Muscle weakness (generalized): Secondary | ICD-10-CM

## 2019-08-09 DIAGNOSIS — R278 Other lack of coordination: Secondary | ICD-10-CM

## 2019-08-09 NOTE — Therapy (Signed)
[facility] [address] [place], [place], [address] Phone: [phone]   Fax:  [phone]  Physical Therapy Treatment  Patient Details  Name: Tracy Harris MRN: [id number] Date of Birth: 12-27-1980 Referring Provider (PT): Rada Hay, PA-C   Encounter Date: 08/09/2019   PT End of Session - 08/09/19 0943    Visit Number 5    Date for PT Re-Evaluation 08/23/19    Authorization Type BCBS    PT Start Time 0930    PT Stop Time 1010    PT Time Calculation (min) 40 min    Activity Tolerance Patient tolerated treatment well;No increased pain    Behavior During Therapy WFL for tasks assessed/performed           Past Medical History:  Diagnosis Date  . Anxiety   . Bipolar 1 disorder (HCC)   . Dichorionic diamniotic twin pregnancy in third trimester 05/02/2016  . Endometriosis   . GERD (gastroesophageal reflux disease)   . History of anorexia nervosa    hospitalized age 76 and 35  . History of kidney stones   . History of positive PPD   . Mild asthma   . PONV (postoperative nausea and vomiting)   . Postpartum care following cesarean delivery Indication: PROM / Di-Di Twins / Repeat (3/10) 05/02/2016  . Premature rupture of membranes 05/02/2016  . Previous cesarean delivery affecting pregnancy 05/02/2016  . Wears contact lenses     Past Surgical History:  Procedure Laterality Date  . CESAREAN SECTION  02/11/2011   Procedure: CESAREAN SECTION;  Surgeon: Serita Kyle, MD;  Location: [facility];  Service: Gynecology;  Laterality: N/A;  . CESAREAN SECTION MULTI-GESTATIONAL N/A 05/02/2016   Procedure: CESAREAN SECTION Multiple Gestation;  Surgeon: Maxie Better, MD;  Location: [facility];  Service: Obstetrics;  Laterality: N/A;  . CHOLECYSTECTOMY  2019  . DX  LAPAROSCOPY/  BX LEFT OVARIAN CYST/  ABLATION AND EXCISION ENDOMETRIOSIS/ LYSIS OF ADHESIONS  03-21-2010   and chromopurtubation  .  LAPAROSCOPY N/A 09/20/2013   Procedure: LAPAROSCOPY LYSIS OF ADHESIONS,  EXCISION OF ENDOMETRIOSIS RIGHT OVARIAN CYSTECTOMY.;  Surgeon: Fermin Schwab, MD;  Location: [facility];  Service: Gynecology;  Laterality: N/A;  . LASER ABLATION HPV , VAGINA, CERVIX, PERIANAL , AND VULVA  08-15-2002  . TONSILLECTOMY  2011    There were no vitals filed for this visit.   Subjective Assessment - 08/09/19 0936    Subjective The desert harvest reveleum is helping. The abdominal muscles are kicking in more. I can rotate the left hip outward better.    Patient Stated Goals reduce pain with penile penetration    Currently in Pain? Yes    Pain Score 6    vulva area 6/10   Pain Location Vagina    Pain Orientation Mid    Pain Descriptors / Indicators Burning    Pain Type Chronic pain    Pain Onset More than a month ago    Aggravating Factors  druing intercourse; vulvar with burning being at the pool    Pain Relieving Factors no intercourse    Multiple Pain Sites No                          Pelvic Floor Special Questions - 08/09/19 0001    External Palpation erytemia along the top section of the introitus    Pelvic Floor Internal Exam Patient confirms identification and approves PT to  assess pelvic floor and treatment    Exam Type Vaginal    Palpation tenderness located on the left pelvic floor    Strength fair squeeze, definite lift             [facility] Adult PT Treatment/Exercise - 08/09/19 0001      Self-Care   Self-Care Other Self-Care Comments    Other Self-Care Comments  instructe patient how to use the vibrator to massage the left side of the pelvic floor; instruction on fascial release on the clitorus and instruction on vaginal lubricants that are better for the vaginal tissue      Lumbar Exercises: Aerobic   Stationary Bike level 2 for 6 mintues while assessing patient      Manual Therapy   Manual Therapy Myofascial release;Internal Pelvic Floor     Myofascial Release fascial release around the clitorus, labia minora    Internal Pelvic Floor left obturator internist with hip motion, left side of the bladder with fascial release                  PT Education - 08/09/19 1011    Education Details instruction on how to use the internal vaginal massager; instruction on how to do fascial release around the clitorus; instruction on lubricants    Person(s) Educated Patient    Methods Explanation;Demonstration;Handout    Comprehension Verbalized understanding            PT Short Term Goals - 08/02/19 1727      PT SHORT TERM GOAL #1   Title independent with initial HEP    Time 4    Period Weeks    Status Achieved      PT SHORT TERM GOAL #2   Title able to perform perineal massage due to decreased tissue sensitivity    Time 4    Period Weeks    Status Achieved      PT SHORT TERM GOAL #3   Title understand about vaginal lubricants and moisturizers to improve vaginal health    Time 4    Period Weeks    Status Achieved             PT Long Term Goals - 08/09/19 1033      PT LONG TERM GOAL #1   Title independent with advanced HEP    Time 12    Period Weeks    Status On-going      PT LONG TERM GOAL #2   Title able to have penile penetrative intercourse with marinoff score 1/3 due to improve elongation of the tissue    Baseline when deeper there was no pain    Time 12    Period Weeks    Status On-going      PT LONG TERM GOAL #3   Title urinary leakage decreased >/= 75% due to improve pelvic floor strength >/= 3/5 for 10 seconds and bilateral hip strength >/= 4/5    Baseline not wearing a pad    Time 12    Period Weeks    Status On-going      PT LONG TERM GOAL #4   Title able to engage her core correctly with daily activities so she is able to perform her tasks with improved mobility    Time 12    Period Weeks    Status On-going                 Plan - 08/09/19 1013    Clinical Impression  Statement After fascial release around the clitorus there was less erythemia. Patient is not wearing a pad now due to decreased in urinary leakage. She is having less vulvar buring due to using the [employer]. Patient has improve left hip mobility. Patient understands on how to use the vaginal moisturizers. Patient was using Astroglide for lubricants and was educated on better ones that will not decrease the good bacteria. Patient pelvic floor strength is now 3/5. Patient will benefit from skilled therapy to improve tissue elongation and improve strength.    Personal Factors and Comorbidities Comorbidity 2;Time since onset of injury/illness/exacerbation;Sex    Comorbidities c-section 2x (1 time with twins); endometriosis; Laproscopic surgery 2x    Examination-Activity Limitations Continence;Toileting    Examination-Participation Restrictions Interpersonal Relationship    Stability/Clinical Decision Making Evolving/Moderate complexity    Rehab Potential Good    PT Frequency 1x / week    PT Duration 12 weeks    PT Treatment/Interventions Biofeedback;Cryotherapy;Electrical Stimulation;Moist Heat;Ultrasound;Neuromuscular re-education;Therapeutic exercise;Therapeutic activities;Patient/family education;Manual techniques;Dry needling;Scar mobilization;Spinal Manipulations    PT Next Visit Plan continue with fascial work around the clitorus, and internally around the left pelvic wall, left hip stretch posteriorly    Consulted and Agree with Plan of Care Patient           Patient will benefit from skilled therapeutic intervention in order to improve the following deficits and impairments:  Decreased coordination, Decreased range of motion, Increased fascial restricitons, Pain, Decreased activity tolerance, Decreased strength, Decreased mobility, Decreased scar mobility  Visit Diagnosis: Muscle weakness (generalized)  Cramp and spasm  Other urinary incontinence  Other lack of  coordination     Problem List Patient Active Problem List   Diagnosis Date Noted  . Premature rupture of membranes 05/02/2016  . Dichorionic diamniotic twin pregnancy in third trimester 05/02/2016  . Previous cesarean delivery affecting pregnancy 05/02/2016  . Postpartum care s/p repeat c-section: twins (3/10) 05/02/2016    Earlie Counts, PT 08/09/19 10:36 AM   [facility] [address], [place], [place], [address] Phone: [phone]   Fax:  [phone]  Name: Tracy Harris MRN: [id number] Date of Birth: 03-05-1980

## 2019-08-09 NOTE — Patient Instructions (Addendum)
  Lubrication . Used for intercourse to reduce friction . Avoid ones that have glycerin, warming gels, tingling gels, icing or cooling gel, scented . Avoid parabens due to a preservative similar to female sex hormone . May need to be reapplied once or several times during sexual activity . Can be applied to both partners genitals prior to vaginal penetration to minimize friction or irritation . Prevent irritation and mucosal tears that cause post coital pain and increased the risk of vaginal and urinary tract infections . Oil-based lubricants cannot be used with condoms due to breaking them down.  Least likely to irritate vaginal tissue.  . Plant based-lubes are safe . Silicone-based lubrication are thicker and last long and used for post-menopausal women  Vaginal Lubricators Here is a list of some suggested lubricators you can use for intercourse. Use the most hypoallergenic product.  You can place on you or your partner.   Slippery Stuff ( water based)  Sylk or Sliquid Natural H2O ( good  if frequent UTI's)- walmart, amazon  Sliquid organics silk-(aloe and silicone based )  Blossom Organics (www.blossom-organics.com)- (aloe based )  Coconut oil, olive oil -not good with condoms   PJur Woman Nude- (water based) amazon  Uberlube- ( silicon) Amazon  Aloe Vera- Sprouts has an organic one  Yes lubricant- (water based and has plant oil based similar to silicone) Amazon  Wet Platinum-Silicone, Target, [facility]  Olive and Bee intimate cream-  www.oliveandbee.com.au  Pink - Amazon  Wet stuff  Erosense Sync- walmart, amazon Things to avoid in lubricants are glycerin, warming gels, tingling gels, icing or cooling  gels, and scented gels.  Also avoid Vaseline. KY jelly, Replens, and Astroglide kills good bacteria(lactobacilli)  Things to avoid in the vaginal area . Do not use things to irritate the vulvar area . No lotions- see below . No soaps; can use Aveeno or Calendula  cleanser if needed. Must be gentle . No deodorants . No douches . Good to sleep without underwear to let the vaginal area to air out . No scrubbing: spread the lips to let warm water rinse over labias and pat dry  Creams that can be used on the Vulva Area  V magic-amazon, walmart  Vital V Wild Yam Salve  Julva- Amazon  MoonMaid Botanical Pro-Meno Wild Yam Cream  Coconut oil, olive oil  Cleo by Damiva labial moisturizer -Amazon,   Desert Havest Releveum ( lidocaine) or Desert Harvest Gele       [facility] [address], [address] [place], [place] [address] Phone # [phone] Fax [phone]  

## 2019-08-16 ENCOUNTER — Other Ambulatory Visit: Payer: Self-pay

## 2019-08-16 ENCOUNTER — Ambulatory Visit: Payer: BC Managed Care – PPO | Admitting: Physical Therapy

## 2019-08-16 ENCOUNTER — Encounter: Payer: Self-pay | Admitting: Physical Therapy

## 2019-08-16 DIAGNOSIS — M6281 Muscle weakness (generalized): Secondary | ICD-10-CM | POA: Diagnosis not present

## 2019-08-16 DIAGNOSIS — R252 Cramp and spasm: Secondary | ICD-10-CM

## 2019-08-16 DIAGNOSIS — N39498 Other specified urinary incontinence: Secondary | ICD-10-CM

## 2019-08-16 DIAGNOSIS — R278 Other lack of coordination: Secondary | ICD-10-CM

## 2019-08-16 NOTE — Patient Instructions (Signed)
Access Code: [email or web address] URL: [email or web address] Date: 08/16/2019 Prepared by: Eulis Foster  Exercises Supine Bridge with Pelvic Floor Contraction and Hip Rotation - 1 x daily - 7 x weekly - 1 sets - 10 reps Bent Knee Fallouts - 1 x daily - 7 x weekly - 1 sets - 10 reps Cat-Camel - 1 x daily - 7 x weekly - 3 sets - 10 reps [facility] [address] [place], [place] [address] Phone # [phone] Fax [phone]

## 2019-08-16 NOTE — Therapy (Signed)
[facility] [address], [place], [place], [address] Phone: [phone]   Fax:  [phone]  Physical Therapy Treatment  Patient Details  Name: ARMILDA VANDERLINDEN MRN: [id number] Date of Birth: [birth year] Referring Provider (PT): Gabriel Cirri, PA-C   Encounter Date: 08/16/2019   PT End of Session - 08/16/19 1011    Visit Number 6    Date for PT Re-Evaluation 08/23/19    Authorization Type BCBS    PT Start Time 0930    PT Stop Time 1008    PT Time Calculation (min) 38 min    Activity Tolerance Patient tolerated treatment well;No increased pain    Behavior During Therapy WFL for tasks assessed/performed           Past Medical History:  Diagnosis Date   Anxiety    Bipolar 1 disorder ([place])    Dichorionic diamniotic twin pregnancy in third trimester 05/02/2016   Endometriosis    GERD (gastroesophageal reflux disease)    History of anorexia nervosa    hospitalized age 65 and 76   History of kidney stones    History of positive PPD    Mild asthma    PONV (postoperative nausea and vomiting)    Postpartum care following cesarean delivery Indication: PROM / Di-Di Twins / Repeat (3/10) 05/02/2016   Premature rupture of membranes 05/02/2016   Previous cesarean delivery affecting pregnancy 05/02/2016   Wears contact lenses     Past Surgical History:  Procedure Laterality Date   CESAREAN SECTION  02/11/2011   Procedure: CESAREAN SECTION;  Surgeon: Marvene Staff, MD;  Location: [facility];  Service: Gynecology;  Laterality: N/A;   CESAREAN SECTION MULTI-GESTATIONAL N/A 05/02/2016   Procedure: CESAREAN SECTION Multiple Gestation;  Surgeon: Servando Salina, MD;  Location: [facility];  Service: Obstetrics;  Laterality: N/A;   CHOLECYSTECTOMY  2019   DX  LAPAROSCOPY/  BX LEFT OVARIAN CYST/  ABLATION AND EXCISION ENDOMETRIOSIS/ LYSIS OF ADHESIONS  03-21-2010   and chromopurtubation    LAPAROSCOPY N/A 09/20/2013   Procedure: LAPAROSCOPY LYSIS OF ADHESIONS,  EXCISION OF ENDOMETRIOSIS RIGHT OVARIAN CYSTECTOMY.;  Surgeon: Governor Specking, MD;  Location: [facility];  Service: Gynecology;  Laterality: N/A;   LASER ABLATION HPV , VAGINA, CERVIX, PERIANAL , AND VULVA  08-15-2002   TONSILLECTOMY  2011    There were no vitals filed for this visit.   Subjective Assessment - 08/16/19 0935    Subjective After last treatment I have split on the top. I have been using the [facility] and it is helping. I am working on my breathing. No issues with the left hip.    Patient Stated Goals reduce pain with penile penetration              [facility] PT Assessment - 08/16/19 0001      Palpation   SI assessment  pelvis in correct alignment                         [facility] Adult PT Treatment/Exercise - 08/16/19 0001      Lumbar Exercises: Aerobic   Nustep level 3 for 5 minutes while assess patient      Lumbar Exercises: Supine   Clam 10 reps;1 second    Clam Limitations each leg with core contraction and keeping control    Bridge 10 reps;1 second    Bridge Limitations then 10x with heel raises keeping hips leved  Lumbar Exercises: Quadruped   Madcat/Old Horse 15 reps    Madcat/Old Horse Limitations with pulling in the lower abdominals and towel squeeze      Manual Therapy   Manual Therapy Joint mobilization    Manual therapy comments P/ROM to left hip to improve rotation and flexion    Joint Mobilization using Mulligan belt to mobilize the left hip for distraction, posterior and inferior glide grade 3                  PT Education - 08/16/19 1010    Education Details Access Code: [email or web address]    Person(s) Educated Patient    Methods Explanation;Demonstration;Verbal cues;Handout    Comprehension Returned demonstration;Verbalized understanding            PT Short Term Goals - 08/02/19 1727      PT SHORT TERM GOAL #1    Title independent with initial HEP    Time 4    Period Weeks    Status Achieved      PT SHORT TERM GOAL #2   Title able to perform perineal massage due to decreased tissue sensitivity    Time 4    Period Weeks    Status Achieved      PT SHORT TERM GOAL #3   Title understand about vaginal lubricants and moisturizers to improve vaginal health    Time 4    Period Weeks    Status Achieved             PT Long Term Goals - 08/16/19 9798      PT LONG TERM GOAL #1   Title independent with advanced HEP    Time 12    Period Weeks    Status On-going      PT LONG TERM GOAL #3   Title urinary leakage decreased >/= 75% due to improve pelvic floor strength >/= 3/5 for 10 seconds and bilateral hip strength >/= 4/5    Baseline no leakage    Time 12    Period Weeks    Status Achieved      PT LONG TERM GOAL #4   Title able to engage her core correctly with daily activities so she is able to perform her tasks with improved mobility    Time 12    Period Weeks    Status On-going                 Plan - 08/16/19 0950    Clinical Impression Statement Patient had less left hip tightness after manual mobilization. Patient is not having urinary leakage. She is learning how to contract her abdomen while moving her legs. She has difficulty keeping her hips leveled with bridge and heels up. Patient is able to perform 360 breath now. Patient will benefit from skilled therapy to improve tissue elongation and improve strength.    Personal Factors and Comorbidities Comorbidity 2;Time since onset of injury/illness/exacerbation;Sex    Comorbidities c-section 2x (1 time with twins); endometriosis; Laproscopic surgery 2x    Examination-Activity Limitations Continence;Toileting    Examination-Participation Restrictions Interpersonal Relationship    Stability/Clinical Decision Making Evolving/Moderate complexity    Rehab Potential Good    PT Frequency 1x / week    PT Duration 12 weeks    PT  Treatment/Interventions Biofeedback;Cryotherapy;Electrical Stimulation;Moist Heat;Ultrasound;Neuromuscular re-education;Therapeutic exercise;Therapeutic activities;Patient/family education;Manual techniques;Dry needling;Scar mobilization;Spinal Manipulations    PT Next Visit Plan continue with fascial work around the clitorus, progress supine heel sildes; Possible discharge    Consulted  and Agree with Plan of Care Patient           Patient will benefit from skilled therapeutic intervention in order to improve the following deficits and impairments:  Decreased coordination, Decreased range of motion, Increased fascial restricitons, Pain, Decreased activity tolerance, Decreased strength, Decreased mobility, Decreased scar mobility  Visit Diagnosis: Muscle weakness (generalized)  Cramp and spasm  Other urinary incontinence  Other lack of coordination     Problem List Patient Active Problem List   Diagnosis Date Noted   Premature rupture of membranes 05/02/2016   Dichorionic diamniotic twin pregnancy in third trimester 05/02/2016   Previous cesarean delivery affecting pregnancy 05/02/2016   Postpartum care s/p repeat c-section: twins (3/10) 05/02/2016    Eulis Foster, PT 08/16/19 10:13 AM   [facility] [address] [place], [place], [address] Phone: [phone]   Fax:  [phone]  Name: JEMILA CAMILLE MRN: [id number] Date of Birth: 11/23/1980

## 2019-08-23 ENCOUNTER — Other Ambulatory Visit: Payer: Self-pay

## 2019-08-23 ENCOUNTER — Ambulatory Visit: Payer: BC Managed Care – PPO | Admitting: Physical Therapy

## 2019-08-23 ENCOUNTER — Encounter: Payer: Self-pay | Admitting: Physical Therapy

## 2019-08-23 DIAGNOSIS — R278 Other lack of coordination: Secondary | ICD-10-CM

## 2019-08-23 DIAGNOSIS — M6281 Muscle weakness (generalized): Secondary | ICD-10-CM

## 2019-08-23 DIAGNOSIS — R252 Cramp and spasm: Secondary | ICD-10-CM

## 2019-08-23 DIAGNOSIS — N39498 Other specified urinary incontinence: Secondary | ICD-10-CM

## 2019-08-23 NOTE — Patient Instructions (Signed)
Access Code: [email or web address] URL: [email or web address] Date: 08/23/2019 Prepared by: Eulis Foster  Exercises Supine Piriformis Stretch with Leg Straight - 1 x daily - 7 x weekly - 1 sets - 2 reps Prone Terminal Knee Extension - 1 x daily - 7 x weekly - 3 sets - 10 reps Prone Hip Extension - Two Pillows - 1 x daily - 7 x weekly - 3 sets - 10 reps Prone Hip Abduction on Slider - 1 x daily - 7 x weekly - 1 sets - 10 reps [facility] [address], [address] [place], [place] [address] Phone # [phone] Fax [phone]

## 2019-08-23 NOTE — Therapy (Signed)
[facility] [address], [address] [place], [place], [address] Phone: [phone]   Fax:  [phone]  Physical Therapy Treatment  Patient Details  Name: Tracy Harris MRN: [id number] Date of Birth: 1981/02/06 Referring Provider (PT): Rada Hay, PA-C   Encounter Date: 08/23/2019   PT End of Session - 08/23/19 1020    Visit Number 7    Date for PT Re-Evaluation 10/04/19    Authorization Type BCBS    PT Start Time 0930    PT Stop Time 1008    PT Time Calculation (min) 38 min    Activity Tolerance Patient tolerated treatment well;No increased pain    Behavior During Therapy WFL for tasks assessed/performed           Past Medical History:  Diagnosis Date  . Anxiety   . Bipolar 1 disorder (HCC)   . Dichorionic diamniotic twin pregnancy in third trimester 05/02/2016  . Endometriosis   . GERD (gastroesophageal reflux disease)   . History of anorexia nervosa    hospitalized age 39 and 45  . History of kidney stones   . History of positive PPD   . Mild asthma   . PONV (postoperative nausea and vomiting)   . Postpartum care following cesarean delivery Indication: PROM / Di-Di Twins / Repeat (3/10) 05/02/2016  . Premature rupture of membranes 05/02/2016  . Previous cesarean delivery affecting pregnancy 05/02/2016  . Wears contact lenses     Past Surgical History:  Procedure Laterality Date  . CESAREAN SECTION  02/11/2011   Procedure: CESAREAN SECTION;  Surgeon: Serita Kyle, MD;  Location: [facility];  Service: Gynecology;  Laterality: N/A;  . CESAREAN SECTION MULTI-GESTATIONAL N/A 05/02/2016   Procedure: CESAREAN SECTION Multiple Gestation;  Surgeon: Maxie Better, MD;  [facility]: [facility];  Service: Obstetrics;  Laterality: N/A;  . CHOLECYSTECTOMY  2019  . DX  LAPAROSCOPY/  BX LEFT OVARIAN CYST/  ABLATION AND EXCISION ENDOMETRIOSIS/ LYSIS OF ADHESIONS  03-21-2010   and chromopurtubation  .  LAPAROSCOPY N/A 09/20/2013   Procedure: LAPAROSCOPY LYSIS OF ADHESIONS,  EXCISION OF ENDOMETRIOSIS RIGHT OVARIAN CYSTECTOMY.;  Surgeon: Fermin Schwab, MD;  Location: [facility];  Service: Gynecology;  Laterality: N/A;  . LASER ABLATION HPV , VAGINA, CERVIX, PERIANAL , AND VULVA  08-15-2002  . TONSILLECTOMY  2011    There were no vitals filed for this visit.   Subjective Assessment - 08/23/19 0938    Subjective Left hip is still doing well. The right hip is pulling. I am starting to itch in the vulva area.    Patient Stated Goals reduce pain with penile penetration    Currently in Pain? Yes    Pain Score 6     Pain Location Vagina    Pain Orientation Mid    Pain Descriptors / Indicators Burning    Pain Type Chronic pain    Pain Onset More than a month ago    Pain Frequency Intermittent    Aggravating Factors  during intercourse, vulvar with burning being at the pool    Pain Relieving Factors no intercourse    Multiple Pain Sites No              [facility] PT Assessment - 08/23/19 0001      Assessment   Medical Diagnosis Vulodynia    Referring Provider (PT) Rada Hay, PA-C    Prior Therapy When she was pregnant and walking; therapy after she had her twins  Precautions   Precautions None      Restrictions   Weight Bearing Restrictions No      Prior Function   Level of Independence Independent    Vocation Full time employment    Buyer, retail    Leisure baking; standing      Cognition   Overall Cognitive Status Within Functional Limits for tasks assessed      Posture/Postural Control   Posture/Postural Control No significant limitations      ROM / Strength   AROM / PROM / Strength AROM;PROM;Strength      AROM   Lumbar Flexion full    Lumbar Extension full     Lumbar - Right Side Bend full    Lumbar - Left Side Bend full    Lumbar - Right Rotation decreased by 25%    Lumbar - Left Rotation decreased by 75%      Strength     Right Hip Flexion 4+/5    Right Hip Extension 5/5    Right Hip ABduction 3+/5    Left Hip Flexion 5/5    Left Hip Extension 3+/5    Left Hip Internal Rotation 5/5    Left Hip ABduction 3+/5    Left Hip ADduction 5/5      Palpation   SI assessment  pelvis in correct alignment                      Pelvic Floor Special Questions - 08/23/19 0001    Diastasis Recti 1 finger above and 2 finger below     Strength fair squeeze, definite lift             [facility] Adult PT Treatment/Exercise - 08/23/19 0001      Lumbar Exercises: Stretches   Piriformis Stretch Right;2 reps;30 seconds    Piriformis Stretch Limitations supine with trunk rotation      Lumbar Exercises: Aerobic   Stationary Bike level 3 for 5 mintues while assessing patient      Knee/Hip Exercises: Prone   Hip Extension Strengthening;Left;1 set;10 reps   doing TKE   Hip Extension Limitations right10 times with full extension    Other Prone Exercises prone hip abduction with feet off the mat and pillow under the abdomen      Manual Therapy   Manual Therapy Joint mobilization    Joint Mobilization using Mulligan belt to mobilize the right posterior hip then stretch it                  PT Education - 08/23/19 1013    Education Details Access Code: [email or web address]    Person(s) Educated Patient    Methods Explanation;Demonstration;Verbal cues;Handout    Comprehension Verbalized understanding;Returned demonstration            PT Short Term Goals - 08/02/19 1727      PT SHORT TERM GOAL #1   Title independent with initial HEP    Time 4    Period Weeks    Status Achieved      PT SHORT TERM GOAL #2   Title able to perform perineal massage due to decreased tissue sensitivity    Time 4    Period Weeks    Status Achieved      PT SHORT TERM GOAL #3   Title understand about vaginal lubricants and moisturizers to improve vaginal health    Time 4    Period Weeks    Status Achieved  PT Long Term Goals - 08/23/19 1015      PT LONG TERM GOAL #1   Title independent with advanced HEP    Time 12    Period Weeks    Status On-going      PT LONG TERM GOAL #2   Title able to have penile penetrative intercourse with marinoff score 1/3 due to improve elongation of the tissue    Baseline no intercourse for several weeks    Time 12    Period Weeks    Status On-going      PT LONG TERM GOAL #3   Title urinary leakage decreased >/= 75% due to improve pelvic floor strength >/= 3/5 for 10 seconds and bilateral hip strength >/= 4/5    Time 12    Period Weeks    Status Achieved      PT LONG TERM GOAL #4   Title able to engage her core correctly with daily activities so she is able to perform her tasks with improved mobility    Time 12    Period Weeks    Status On-going                 Plan - 08/23/19 0956    Clinical Impression Statement Patient has no urinary leakage. Patient has weakness in the left hip extensor and bilateral hip abduction. Patient has difficulty with stabilizing her core with leg movement. Patient is using moisturizer to the vaginal area to reduce the irritation especially with her in the pool. Patient has not had intercourse recently so do not know about the pain. Patient will benefit from skilled therapy to improve strength and tissue elongation.    Personal Factors and Comorbidities Comorbidity 2;Time since onset of injury/illness/exacerbation;Sex    Comorbidities c-section 2x (1 time with twins); endometriosis; Laproscopic surgery 2x    Examination-Activity Limitations Continence;Toileting    Examination-Participation Restrictions Interpersonal Relationship    Stability/Clinical Decision Making Evolving/Moderate complexity    Rehab Potential Good    PT Frequency 1x / week    PT Duration 6 weeks    PT Treatment/Interventions Biofeedback;Cryotherapy;Electrical Stimulation;Moist Heat;Ultrasound;Neuromuscular re-education;Therapeutic  exercise;Therapeutic activities;Patient/family education;Manual techniques;Dry needling;Scar mobilization;Spinal Manipulations    PT Next Visit Plan work on hip strength and progress abdominal strength    PT Home Exercise Plan Access Code: [email or web address]    Consulted and Agree with Plan of Care Patient           Patient will benefit from skilled therapeutic intervention in order to improve the following deficits and impairments:  Decreased coordination, Decreased range of motion, Increased fascial restricitons, Pain, Decreased activity tolerance, Decreased strength, Decreased mobility, Decreased scar mobility  Visit Diagnosis: Muscle weakness (generalized) - Plan: PT plan of care cert/re-cert  Cramp and spasm - Plan: PT plan of care cert/re-cert  Other urinary incontinence - Plan: PT plan of care cert/re-cert  Other lack of coordination - Plan: PT plan of care cert/re-cert     Problem List Patient Active Problem List   Diagnosis Date Noted  . Premature rupture of membranes 05/02/2016  . Dichorionic diamniotic twin pregnancy in third trimester 05/02/2016  . Previous cesarean delivery affecting pregnancy 05/02/2016  . Postpartum care s/p repeat c-section: twins (3/10) 05/02/2016    Eulis Foster, PT 08/23/19 10:25 AM   [facility] [address] [place], [place], [address] Phone: [phone]   Fax:  [phone]  Name: Tracy Harris MRN: [id number] Date of Birth: Jan 29, 1981

## 2019-09-04 ENCOUNTER — Encounter: Payer: Self-pay | Admitting: Physical Therapy

## 2019-09-04 ENCOUNTER — Ambulatory Visit: Payer: BC Managed Care – PPO | Attending: Dermatology | Admitting: Physical Therapy

## 2019-09-04 ENCOUNTER — Other Ambulatory Visit: Payer: Self-pay

## 2019-09-04 DIAGNOSIS — R278 Other lack of coordination: Secondary | ICD-10-CM | POA: Insufficient documentation

## 2019-09-04 DIAGNOSIS — N39498 Other specified urinary incontinence: Secondary | ICD-10-CM | POA: Insufficient documentation

## 2019-09-04 DIAGNOSIS — R252 Cramp and spasm: Secondary | ICD-10-CM | POA: Insufficient documentation

## 2019-09-04 DIAGNOSIS — M6281 Muscle weakness (generalized): Secondary | ICD-10-CM | POA: Diagnosis present

## 2019-09-04 NOTE — Therapy (Signed)
[facility] [address] [place], [place], [address] Phone: [phone]   Fax:  [phone]  Physical Therapy Treatment  Patient Details  Name: Tracy Harris MRN: [id number] Date of Birth: Apr 30, 1980 Referring Provider (PT): Rada Hay, PA-C   Encounter Date: 09/04/2019   PT End of Session - 09/04/19 0936    Visit Number 8    Date for PT Re-Evaluation 10/04/19    Authorization Type BCBS    PT Start Time 0930    PT Stop Time 1010    PT Time Calculation (min) 40 min    Activity Tolerance Patient tolerated treatment well;No increased pain    Behavior During Therapy WFL for tasks assessed/performed           Past Medical History:  Diagnosis Date   Anxiety    Bipolar 1 disorder (HCC)    Dichorionic diamniotic twin pregnancy in third trimester 05/02/2016   Endometriosis    GERD (gastroesophageal reflux disease)    History of anorexia nervosa    hospitalized age 29 and 78   History of kidney stones    History of positive PPD    Mild asthma    PONV (postoperative nausea and vomiting)    Postpartum care following cesarean delivery Indication: PROM / Di-Di Twins / Repeat (3/10) 05/02/2016   Premature rupture of membranes 05/02/2016   Previous cesarean delivery affecting pregnancy 05/02/2016   Wears contact lenses     Past Surgical History:  Procedure Laterality Date   CESAREAN SECTION  02/11/2011   Procedure: CESAREAN SECTION;  Surgeon: Serita Kyle, MD;  Location: [facility];  Service: Gynecology;  Laterality: N/A;   CESAREAN SECTION MULTI-GESTATIONAL N/A 05/02/2016   Procedure: CESAREAN SECTION Multiple Gestation;  Surgeon: Maxie Better, MD;  [facility]: [facility];  Service: Obstetrics;  Laterality: N/A;   CHOLECYSTECTOMY  2019   DX  LAPAROSCOPY/  BX LEFT OVARIAN CYST/  ABLATION AND EXCISION ENDOMETRIOSIS/ LYSIS OF ADHESIONS  03-21-2010   and chromopurtubation    LAPAROSCOPY N/A 09/20/2013   Procedure: LAPAROSCOPY LYSIS OF ADHESIONS,  EXCISION OF ENDOMETRIOSIS RIGHT OVARIAN CYSTECTOMY.;  Surgeon: Fermin Schwab, MD;  Location: [facility];  Service: Gynecology;  Laterality: N/A;   LASER ABLATION HPV , VAGINA, CERVIX, PERIANAL , AND VULVA  08-15-2002   TONSILLECTOMY  2011    There were no vitals filed for this visit.   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 It is back to being sore and need to use the medication. I have been working on my hip. Sometimes I feel like the hip has pulling feeling.    Patient Stated Goals reduce pain with penile penetration    Currently in Pain? Yes    Pain Score 6    with the lichens   Pain Location Vagina    Pain Orientation Mid    Pain Descriptors / Indicators Burning    Pain Type Chronic pain    Pain Onset More than a month ago    Pain Frequency Intermittent    Aggravating Factors  during intercourse, vulvar with burning being at the pool    Pain Relieving Factors no intercourse    Multiple Pain Sites No                             [facility] Adult PT Treatment/Exercise - 09/04/19 0001  Therapeutic Activites    Therapeutic Activities Other Therapeutic Activities    Other Therapeutic Activities education on correct toileting and how to have her knees above her hips and relax the pelvic floor with breath      Lumbar Exercises: Supine   Heel Slides 10 reps    Heel Slides Limitations each side with abdominal contraction    Bridge with March 10 reps    Bridge with [employer] Limitations each side    Other Supine Lumbar Exercises hip flexion isometric while the other arm and leg flex 10x each side      Lumbar Exercises: Quadruped   Straight Leg Raise 10 reps;1 second   left     Knee/Hip Exercises: Sidelying   Hip ABduction Strengthening;Right;Left;1 set;10 reps      Manual Therapy   Manual Therapy Soft tissue  mobilization;Joint mobilization    Manual therapy comments education on abdominal massage to promote peristalic motion    Joint Mobilization using Mulligan belt to mobilize the right posterior hip then stretch it    Soft tissue mobilization circular massage to the abdomen to promote peristalic motion of the intestines                  PT Education - 09/04/19 1010    Education Details Access Code: [email or web address]; abdominal massage; how to toilet correctly    Person(s) Educated Patient    Methods Explanation;Demonstration;Handout    Comprehension Returned demonstration;Verbalized understanding            PT Short Term Goals - 08/02/19 1727      PT SHORT TERM GOAL #1   Title independent with initial HEP    Time 4    Period Weeks    Status Achieved      PT SHORT TERM GOAL #2   Title able to perform perineal massage due to decreased tissue sensitivity    Time 4    Period Weeks    Status Achieved      PT SHORT TERM GOAL #3   Title understand about vaginal lubricants and moisturizers to improve vaginal health    Time 4    Period Weeks    Status Achieved             PT Long Term Goals - 09/04/19 1013      PT LONG TERM GOAL #1   Title independent with advanced HEP    Time 12    Period Weeks    Status On-going      PT LONG TERM GOAL #2   Title able to have penile penetrative intercourse with marinoff score 1/3 due to improve elongation of the tissue    Baseline no intercourse for several weeks    Time 12    Period Weeks    Status On-going      PT LONG TERM GOAL #3   Title urinary leakage decreased >/= 75% due to improve pelvic floor strength >/= 3/5 for 10 seconds and bilateral hip strength >/= 4/5    Baseline slight leakage at this time    Time 12    Period Weeks    Status Achieved      PT LONG TERM GOAL #4   Title able to engage her core correctly with daily activities so she is able to perform her tasks with improved mobility    Time 12    Period Weeks      Status On-going  Plan - 09/04/19 0935    Clinical Impression Statement Patient is having a flare- up with the Lichens so she is having pain and trouble with intercourse. Patient has been constipated which is putting pressure on the bladder and causes some leakage. Patient has learned more advanced exercises for her core which are more challenging. Patient pelvis in correct alignment. Patient will benefit from skilled therapy to improve strength and tissue elongation.    Personal Factors and Comorbidities Comorbidity 2;Time since onset of injury/illness/exacerbation;Sex    Comorbidities c-section 2x (1 time with twins); endometriosis; Laproscopic surgery 2x    Examination-Activity Limitations Continence;Toileting    Examination-Participation Restrictions Interpersonal Relationship    Stability/Clinical Decision Making Evolving/Moderate complexity    Rehab Potential Good    PT Frequency 1x / week    PT Duration 6 weeks    PT Treatment/Interventions Biofeedback;Cryotherapy;Electrical Stimulation;Moist Heat;Ultrasound;Neuromuscular re-education;Therapeutic exercise;Therapeutic activities;Patient/family education;Manual techniques;Dry needling;Scar mobilization;Spinal Manipulations    PT Next Visit Plan see how the lichens is doing, progress to 1/2 kneel strength exercises; see if patient wants to be discharged    PT Home Exercise Plan Access Code: [email or web address]    Consulted and Agree with Plan of Care Patient           Patient will benefit from skilled therapeutic intervention in order to improve the following deficits and impairments:  Decreased coordination, Decreased range of motion, Increased fascial restricitons, Pain, Decreased activity tolerance, Decreased strength, Decreased mobility, Decreased scar mobility  Visit Diagnosis: Muscle weakness (generalized)  Cramp and spasm  Other urinary incontinence  Other lack of coordination     Problem List Patient  Active Problem List   Diagnosis Date Noted   Premature rupture of membranes 05/02/2016   Dichorionic diamniotic twin pregnancy in third trimester 05/02/2016   Previous cesarean delivery affecting pregnancy 05/02/2016   Postpartum care s/p repeat c-section: twins (3/10) 05/02/2016    Eulis Foster, PT 09/04/19 10:15 AM   [facility] [address], [address] [place], [place], [address] Phone: [phone]   Fax:  [phone]  Name: Tracy Harris MRN: [id number] Date of Birth: 1980-11-03

## 2019-09-04 NOTE — Patient Instructions (Addendum)
About Abdominal Massage  Abdominal massage, also called external colon massage, is a self-treatment circular massage technique that can reduce and eliminate gas and ease constipation. The colon naturally contracts in waves in a clockwise direction starting from inside the right hip, moving up toward the ribs, across the belly, and down inside the left hip.  When you perform circular abdominal massage, you help stimulate your colon's normal wave pattern of movement called peristalsis.  It is most beneficial when done after eating.  Positioning You can practice abdominal massage with oil while lying down, or in the shower with soap.  Some people find that it is just as effective to do the massage through clothing while sitting or standing.  How to Massage Start by placing your finger tips or knuckles on your right side, just inside your hip bone.  . Make small circular movements while you move upward toward your rib cage.   . Once you reach the bottom right side of your rib cage, take your circular movements across to the left side of the bottom of your rib cage.  . Next, move downward until you reach the inside of your left hip bone.  This is the path your feces travel in your colon. . Continue to perform your abdominal massage in this pattern for 10 minutes each day.     You can apply as much pressure as is comfortable in your massage.  Start gently and build pressure as you continue to practice.  Notice any areas of pain as you massage; areas of slight pain may be relieved as you massage, but if you have areas of significant or intense pain, consult with your healthcare provider.  Other Considerations . General physical activity including bending and stretching can have a beneficial massage-like effect on the colon.  Deep breathing can also stimulate the colon because breathing deeply activates the same nervous system that supplies the colon.   . Abdominal massage should always be used in  combination with a bowel-conscious diet that is high in the proper type of fiber for you, fluids (primarily water), and a regular exercise program.  Toileting Techniques for Bowel Movements    An Evacuation/Defecation Plan   Here are the 4 basic points:  1. Lean forward enough for your elbows to rest on your knees 2. Support your feet on the floor or use a low stool if your feet don't touch the floor  3. Push out your belly as if you have swallowed a beach ball--you should feel a widening of your waist. "Belly Big, Belly Hard" 4. Open and relax your pelvic floor muscles, rather than tightening around the anus  While you are sitting on the toilet pay attention to the following areas: . Jaw and mouth position- relaxed not clenched . Angle of your hips - leaning slightly forward . Whether your feet touch the ground or not - should be flat and supported . Arm placement - rest against your thighs . Spine position - flat back . Waist . Breathing - exhale as you push (like blowing up a balloon or try using other sounds such as ahhhh, shhhhh, ohhhh or grrrrrrr) . Belly - hard and tight as you push . Anus (opening of the anal canal) - relaxed and open as you push . Anus - Tighten and lift pulling the muscle back in after you are done or if taking a break  If you are not successful after 10-15 minutes, try again later.  Avoid negative self-talk  about your toileting experience.   Read this for more details and ask your PT if you need suggestions for adjustments or limitations:  1) Sitting on the toilet  a) Make sure your feet are supported - flat on the floor or step stool b) Many people find it effective to lean forward or raise their knees.  Propping your feet on a step stool (squatty potty is a brand name) can help the muscles around the anus to relax  c) When you lean forward, place your forearms on your thighs for support  2) Relaxing a) Breathe deeply and slowly in through your nose  and out through your mouth. b) To become aware of how to relax your muscles, contracting and releasing muscles can be helpful.  Pull your pelvic floor muscles in tightly by using the image of holding back gas, or closing around the anus (visualize making a circle smaller) and lifting the anus up and in.  Then release the muscles and your anus should drop down and feel open. Repeat 5 times ending with the feeling of relaxation. c) Keep your pelvic floor muscles relaxed; let your belly bulge out. d) The digestive tract starts at the mouth and ends at the anal opening, so be sure to relax both ends of the tube.  Place your tongue on the roof of your mouth with your teeth separated.  This helps relax your mouth and will help to relax the anus at the same time.  3) Emptying (defecation) a) Keep your pelvic floor and sphincter relaxed, then bulge your anal muscles.  Make the anal opening wide.  b) Stick your belly out as if you have swallowed a beach ball. c) Make your belly wall hard using your belly muscles while continuing to breathe. Doing this makes it easier to open your anus. d) Breath out and give a grunt (or try using other sounds such as ahhhh, shhhhh, ohhhh or grrrrrrr). e)  Can also try to act as if you are blowing up a balloon as you push  4) Finishing a) As you finish your bowel movement, pull the pelvic floor muscles up and in.  This will leave your anus in the proper place rather than remaining pushed out and down. If you leave your anus pushed out and down, it will start to feel as though that is normal and give you incorrect signals about needing to have a bowel movement. Access Code: [email or web address] URL: [email or web address] Date: 09/04/2019 Prepared by: Tracy Harris  Exercises Supine Piriformis Stretch with Leg Straight - 1 x daily - 7 x weekly - 1 sets - 2 reps Prone Terminal Knee Extension - 1 x daily - 7 x weekly - 3 sets - 10 reps Prone Hip Abduction on Slider - 1 x  daily - 7 x weekly - 1 sets - 10 reps Supine Heel Slide - 1 x daily - 7 x weekly - 1 sets - 10 reps Marching Bridge - 1 x daily - 7 x weekly - 1 sets - 10 reps Hooklying Isometric Hip Flexion with Opposite Arm - 1 x daily - 7 x weekly - 1 sets - 10 reps Quadruped Leg Lifts - 1 x daily - 7 x weekly - 1 sets - 10 reps Sidelying Diagonal Hip Abduction - 1 x daily - 7 x weekly - 1 sets - 10 reps  [facility] [address] [place], [place] [address] Phone # [phone] Fax [phone]

## 2019-09-13 ENCOUNTER — Encounter: Payer: Self-pay | Admitting: Physical Therapy

## 2019-09-13 ENCOUNTER — Ambulatory Visit: Payer: BC Managed Care – PPO | Admitting: Physical Therapy

## 2019-09-13 ENCOUNTER — Other Ambulatory Visit: Payer: Self-pay

## 2019-09-13 DIAGNOSIS — M6281 Muscle weakness (generalized): Secondary | ICD-10-CM

## 2019-09-13 DIAGNOSIS — R278 Other lack of coordination: Secondary | ICD-10-CM

## 2019-09-13 DIAGNOSIS — R252 Cramp and spasm: Secondary | ICD-10-CM

## 2019-09-13 DIAGNOSIS — N39498 Other specified urinary incontinence: Secondary | ICD-10-CM

## 2019-09-13 NOTE — Therapy (Signed)
[facility] [address], [place], [place], [address] Phone: [phone]   Fax:  [phone]  Physical Therapy Treatment  Patient Details  Name: Tracy Harris MRN: [id number] Date of Birth: 07-08-1980 Referring Provider (PT): Gabriel Cirri, PA-C   Encounter Date: 09/13/2019   PT End of Session - 09/13/19 1057    Visit Number 9    Date for PT Re-Evaluation 10/04/19    Authorization Type BCBS    PT Start Time 1015    PT Stop Time 1055    PT Time Calculation (min) 40 min    Activity Tolerance Patient tolerated treatment well;No increased pain    Behavior During Therapy WFL for tasks assessed/performed           Past Medical History:  Diagnosis Date  . Anxiety   . Bipolar 1 disorder ([place])   . Dichorionic diamniotic twin pregnancy in third trimester 05/02/2016  . Endometriosis   . GERD (gastroesophageal reflux disease)   . History of anorexia nervosa    hospitalized age 59 and 44  . History of kidney stones   . History of positive PPD   . Mild asthma   . PONV (postoperative nausea and vomiting)   . Postpartum care following cesarean delivery Indication: PROM / Di-Di Twins / Repeat (3/10) 05/02/2016  . Premature rupture of membranes 05/02/2016  . Previous cesarean delivery affecting pregnancy 05/02/2016  . Wears contact lenses     Past Surgical History:  Procedure Laterality Date  . CESAREAN SECTION  02/11/2011   Procedure: CESAREAN SECTION;  Surgeon: Marvene Staff, MD;  Location: [facility];  Service: Gynecology;  Laterality: N/A;  . CESAREAN SECTION MULTI-GESTATIONAL N/A 05/02/2016   Procedure: CESAREAN SECTION Multiple Gestation;  Surgeon: Servando Salina, MD;  Location: [facility];  Service: Obstetrics;  Laterality: N/A;  . CHOLECYSTECTOMY  2019  . DX  LAPAROSCOPY/  BX LEFT OVARIAN CYST/  ABLATION AND EXCISION ENDOMETRIOSIS/ LYSIS OF ADHESIONS  03-21-2010   and chromopurtubation  .  LAPAROSCOPY N/A 09/20/2013   Procedure: LAPAROSCOPY LYSIS OF ADHESIONS,  EXCISION OF ENDOMETRIOSIS RIGHT OVARIAN CYSTECTOMY.;  Surgeon: Governor Specking, MD;  Location: [facility];  Service: Gynecology;  Laterality: N/A;  . LASER ABLATION HPV , VAGINA, CERVIX, PERIANAL , AND VULVA  08-15-2002  . TONSILLECTOMY  2011    There were no vitals filed for this visit.   Subjective Assessment - 09/13/19 1022    Subjective I feel good about today being my last day.    Patient Stated Goals reduce pain with penile penetration    Currently in Pain? No/denies              [facility] PT Assessment - 09/13/19 0001      Prior Function   Level of Independence Independent    Vocation Full time employment    Museum/gallery curator    Leisure baking; standing      Cognition   Overall Cognitive Status Within Functional Limits for tasks assessed      Posture/Postural Control   Posture/Postural Control No significant limitations      AROM   Lumbar Flexion full    Lumbar Extension full     Lumbar - Right Side Bend full    Lumbar - Left Side Bend full    Lumbar - Right Rotation full    Lumbar - Left Rotation full      Strength   Right Hip Flexion 5/5  Right Hip Extension 5/5    Right Hip ABduction 3+/5    Left Hip Flexion 5/5    Left Hip Extension 3+/5    Left Hip Internal Rotation 5/5    Left Hip ABduction 3+/5    Left Hip ADduction 5/5      Palpation   SI assessment  pelvis in correct alignment                      Pelvic Floor Special Questions - 09/13/19 0001    Diastasis Recti no separation    Urinary Leakage No             [facility] Adult PT Treatment/Exercise - 09/13/19 0001      Lumbar Exercises: Aerobic   Nustep level 3 for 5 minutes while assess patient      Lumbar Exercises: Supine   Bridge with March 10 reps    Bridge with [employer] Limitations each side    Other Supine Lumbar Exercises hip flexion isometric while the other arm  and leg flex 10x each side    Other Supine Lumbar Exercises tried a curl up but bulged her abdomen so not ready      Lumbar Exercises: Quadruped   Opposite Arm/Leg Raise Right arm/Left leg;Left arm/Right leg;10 reps;1 second      Knee/Hip Exercises: Sidelying   Hip ABduction Strengthening;Right;Left;1 set;10 reps      Knee/Hip Exercises: Prone   Other Prone Exercises modified PLANK holding for 30 sec                  PT Education - 09/13/19 1057    Education Details Access Code: [email or web address]    Person(s) Educated Patient    Methods Explanation;Demonstration;Verbal cues;Handout    Comprehension Returned demonstration;Verbalized understanding            PT Short Term Goals - 08/02/19 1727      PT SHORT TERM GOAL #1   Title independent with initial HEP    Time 4    Period Weeks    Status Achieved      PT SHORT TERM GOAL #2   Title able to perform perineal massage due to decreased tissue sensitivity    Time 4    Period Weeks    Status Achieved      PT SHORT TERM GOAL #3   Title understand about vaginal lubricants and moisturizers to improve vaginal health    Time 4    Period Weeks    Status Achieved             PT Long Term Goals - 09/13/19 1028      PT LONG TERM GOAL #1   Title independent with advanced HEP    Time 12    Period Weeks    Status Achieved      PT LONG TERM GOAL #2   Title able to have penile penetrative intercourse with marinoff score 1/3 due to improve elongation of the tissue    Time 12    Period Weeks    Status Achieved      PT LONG TERM GOAL #3   Title urinary leakage decreased >/= 75% due to improve pelvic floor strength >/= 3/5 for 10 seconds and bilateral hip strength >/= 4/5    Baseline none    Time 12    Period Weeks    Status Achieved      PT LONG TERM GOAL #4   Title able to  engage her core correctly with daily activities so she is able to perform her tasks with improved mobility    Time 12    Period Weeks    Status  Achieved                 Plan - 09/13/19 1039    Clinical Impression Statement Patien has met her goals .She is not leaking urine. She does not have a diastasis recti. Patient understands and demostrates how to engage her core with exercises. Patient has an advanced HEP. Patient marinoff score is 1/3 and has pain iniitally. Patient is ready for discharge.    Personal Factors and Comorbidities Comorbidity 2;Time since onset of injury/illness/exacerbation;Sex    Comorbidities c-section 2x (1 time with twins); endometriosis; Laproscopic surgery 2x    Examination-Activity Limitations Continence;Toileting    Examination-Participation Restrictions Interpersonal Relationship    Stability/Clinical Decision Making Evolving/Moderate complexity    Rehab Potential Good    PT Treatment/Interventions Biofeedback;Cryotherapy;Electrical Stimulation;Moist Heat;Ultrasound;Neuromuscular re-education;Therapeutic exercise;Therapeutic activities;Patient/family education;Manual techniques;Dry needling;Scar mobilization;Spinal Manipulations    PT Next Visit Plan Discharge to HEP    PT Home Exercise Plan Access Code: [email or web address]    Consulted and Agree with Plan of Care Patient           Patient will benefit from skilled therapeutic intervention in order to improve the following deficits and impairments:  Decreased coordination, Decreased range of motion, Increased fascial restricitons, Pain, Decreased activity tolerance, Decreased strength, Decreased mobility, Decreased scar mobility  Visit Diagnosis: Muscle weakness (generalized)  Cramp and spasm  Other urinary incontinence  Other lack of coordination     Problem List Patient Active Problem List   Diagnosis Date Noted  . Premature rupture of membranes 05/02/2016  . Dichorionic diamniotic twin pregnancy in third trimester 05/02/2016  . Previous cesarean delivery affecting pregnancy 05/02/2016  . Postpartum care s/p repeat c-section: twins (3/10)  05/02/2016    Earlie Counts, PT 09/13/19 11:01 AM   [facility] [address] [place], [place], [address] Phone: [phone]   Fax:  [phone]  Name: Tracy Harris MRN: [id number] Date of Birth: [birth year]  PHYSICAL THERAPY DISCHARGE SUMMARY  Visits from Start of Care: 9  Current functional level related to goals / functional outcomes: See above.    Remaining deficits: See above.    Education / Equipment: HEP Plan: Patient agrees to discharge.  Patient goals were met. Patient is being discharged due to meeting the stated rehab goals.  Thank you for the referral. Earlie Counts, PT 09/13/19 11:01 AM  ?????

## 2019-09-13 NOTE — Patient Instructions (Signed)
Access Code: [email or web address] URL: [email or web address] Date: 09/13/2019 Prepared by: Eulis Foster  Exercises Supine Piriformis Stretch with Leg Straight - 1 x daily - 7 x weekly - 1 sets - 2 reps Marching Bridge - 1 x daily - 7 x weekly - 1 sets - 10 reps Hooklying Isometric Hip Flexion with Opposite Arm - 1 x daily - 7 x weekly - 1 sets - 10 reps Sidelying Diagonal Hip Abduction - 1 x daily - 7 x weekly - 1 sets - 10 reps Bird Dog - 1 x daily - 7 x weekly - 2 sets - 10 reps Plank on Knees - 1 x daily - 7 x weekly - 1 sets - 5 reps - 30 sec hold [facility] [address] [place], [place] [address] Phone # [phone] Fax [phone]

## 2020-03-03 ENCOUNTER — Telehealth: Payer: BC Managed Care – PPO | Admitting: Nurse Practitioner

## 2020-03-03 DIAGNOSIS — R059 Cough, unspecified: Secondary | ICD-10-CM | POA: Diagnosis not present

## 2020-03-03 MED ORDER — BENZONATATE 100 MG PO CAPS: 100.0000 mg | ORAL_CAPSULE | Freq: Three times a day (TID) | ORAL | 0 refills | Status: AC | PRN

## 2020-03-03 NOTE — Progress Notes (Signed)
We are sorry that you are not feeling well.  Here is how we plan to help!  Based on your presentation I believe you most likely have A cough due to a virus.  This is called viral bronchitis and is best treated by rest, plenty of fluids and control of the cough.  You may use Ibuprofen or Tylenol as directed to help your symptoms.     In addition you may use A prescription cough medication called Tessalon Perles 100mg . You may take 1-2 capsules every 8 hours as needed for your cough.   We will provide symptomatic treatment until you get your COVID test results.I do not recommend antibiotics based on yours symptoms and the duration of your symptoms.  I would not return to work until you have received your results. If your test is positive for COVID, you would isolate according to CDC guidelines and continue symptomatic treatment.    From your responses in the eVisit questionnaire you describe inflammation in the upper respiratory tract which is causing a significant cough.  This is commonly called Bronchitis and has four common causes:    Allergies  Viral Infections  Acid Reflux  Bacterial Infection Allergies, viruses and acid reflux are treated by controlling symptoms or eliminating the cause. An example might be a cough caused by taking certain blood pressure medications. You stop the cough by changing the medication. Another example might be a cough caused by acid reflux. Controlling the reflux helps control the cough.  USE OF BRONCHODILATOR ("RESCUE") INHALERS: There is a risk from using your bronchodilator too frequently.  The risk is that over-reliance on a medication which only relaxes the muscles surrounding the breathing tubes can reduce the effectiveness of medications prescribed to reduce swelling and congestion of the tubes themselves.  Although you feel brief relief from the bronchodilator inhaler, your asthma may actually be worsening with the tubes becoming more swollen and filled  with mucus.  This can delay other crucial treatments, such as oral steroid medications. If you need to use a bronchodilator inhaler daily, several times per day, you should discuss this with your provider.  There are probably better treatments that could be used to keep your asthma under control.     HOME CARE . Only take medications as instructed by your medical team. . Complete the entire course of an antibiotic. . Drink plenty of fluids and get plenty of rest. . Avoid close contacts especially the very young and the elderly . Cover your mouth if you cough or cough into your sleeve. . Always remember to wash your hands . A steam or ultrasonic humidifier can help congestion.   GET HELP RIGHT AWAY IF: . You develop worsening fever. . You become short of breath . You cough up blood. . Your symptoms persist after you have completed your treatment plan MAKE SURE YOU   Understand these instructions.  Will watch your condition.  Will get help right away if you are not doing well or get worse.  Your e-visit answers were reviewed by a board certified advanced clinical practitioner to complete your personal care plan.  Depending on the condition, your plan could have included both over the counter or prescription medications. If there is a problem please reply  once you have received a response from your provider. Your safety is important to .  If you have drug allergies check your prescription carefully.    You can use MyChart to ask questions about today's visit, request a  non-urgent call back, or ask for a work or school excuse for 24 hours related to this e-Visit. If it has been greater than 24 hours you will need to follow up with your provider, or enter a new e-Visit to address those concerns. You will get an e-mail in the next two days asking about your experience.  I hope that your e-visit has been valuable and will speed your recovery. Thank you for using e-visits.  I have spent at  least 5 minutes reviewing and documenting in the patient's chart.

## 2020-04-13 ENCOUNTER — Encounter: Payer: Self-pay | Admitting: Physician Assistant

## 2020-04-13 ENCOUNTER — Telehealth: Payer: BC Managed Care – PPO | Admitting: Physician Assistant

## 2020-04-13 DIAGNOSIS — J301 Allergic rhinitis due to pollen: Secondary | ICD-10-CM | POA: Insufficient documentation

## 2020-04-13 DIAGNOSIS — G43009 Migraine without aura, not intractable, without status migrainosus: Secondary | ICD-10-CM

## 2020-04-13 DIAGNOSIS — F419 Anxiety disorder, unspecified: Secondary | ICD-10-CM | POA: Insufficient documentation

## 2020-04-13 DIAGNOSIS — N200 Calculus of kidney: Secondary | ICD-10-CM | POA: Insufficient documentation

## 2020-04-13 DIAGNOSIS — M5430 Sciatica, unspecified side: Secondary | ICD-10-CM | POA: Insufficient documentation

## 2020-04-13 MED ORDER — ONDANSETRON 8 MG PO TBDP: 8.0000 mg | ORAL_TABLET | Freq: Three times a day (TID) | ORAL | 0 refills | Status: DC | PRN

## 2020-04-13 MED ORDER — SUMATRIPTAN SUCCINATE 50 MG PO TABS: 50.0000 mg | ORAL_TABLET | ORAL | 0 refills | Status: DC | PRN

## 2020-04-13 NOTE — Patient Instructions (Signed)
I sent a prescription for Imitrex and Zofran to your pharmacy. I encourage you to stay well-hydrated and get plenty of rest. If migraines persist, I encourage you to follow-up with your primary care provider.  I hope that you feel better soon, please let us know if there is anything else we can do for you.  Roney Jaffe, PA-C Physician Assistant [facility] [email or web address]    Migraine Headache A migraine headache is an intense, throbbing pain on one side or both sides of the head. Migraine headaches may also cause other symptoms, such as nausea, vomiting, and sensitivity to light and noise. A migraine headache can last from 4 hours to 3 days. Talk with your doctor about what things may bring on (trigger) your migraine headaches. What are the causes? The exact cause of this condition is not known. However, a migraine may be caused when nerves in the brain become irritated and release chemicals that cause inflammation of blood vessels. This inflammation causes pain. This condition may be triggered or caused by:  Drinking alcohol.  Smoking.  Taking medicines, such as: ? Medicine used to treat chest pain (nitroglycerin). ? Birth control pills. ? Estrogen. ? Certain blood pressure medicines.  Eating or drinking products that contain nitrates, glutamate, aspartame, or tyramine. Aged cheeses, chocolate, or caffeine may also be triggers.  Doing physical activity. Other things that may trigger a migraine headache include:  Menstruation.  Pregnancy.  Hunger.  Stress.  Lack of sleep or too much sleep.  Weather changes.  Fatigue. What increases the risk? The following factors may make you more likely to experience migraine headaches:  Being a certain age. This condition is more common in people who are 28-65 years old.  Being female.  Having a family history of migraine headaches.  Being Caucasian.  Having a  mental health condition, such as depression or anxiety.  Being obese. What are the signs or symptoms? The main symptom of this condition is pulsating or throbbing pain. This pain may:  Happen in any area of the head, such as on one side or both sides.  Interfere with daily activities.  Get worse with physical activity.  Get worse with exposure to bright lights or loud noises. Other symptoms may include:  Nausea.  Vomiting.  Dizziness.  General sensitivity to bright lights, loud noises, or smells. Before you get a migraine headache, you may get warning signs (an aura). An aura may include:  Seeing flashing lights or having blind spots.  Seeing bright spots, halos, or zigzag lines.  Having tunnel vision or blurred vision.  Having numbness or a tingling feeling.  Having trouble talking.  Having muscle weakness. Some people have symptoms after a migraine headache (postdromal phase), such as:  Feeling tired.  Difficulty concentrating. How is this diagnosed? A migraine headache can be diagnosed based on:  Your symptoms.  A physical exam.  Tests, such as: ? CT scan or an MRI of the head. These imaging tests can help rule out other causes of headaches. ? Taking fluid from the spine (lumbar puncture) and analyzing it (cerebrospinal fluid analysis, or CSF analysis). How is this treated? This condition may be treated with medicines that:  Relieve pain.  Relieve nausea.  Prevent migraine headaches. Treatment for this condition may also include:  Acupuncture.  Lifestyle changes like avoiding foods that trigger migraine headaches.  Biofeedback.  Cognitive behavioral therapy. Follow these instructions at home: Medicines  Take over-the-counter and prescription medicines only as told by  your health care provider.  Ask your health care provider if the medicine prescribed to you: ? Requires you to avoid driving or using heavy machinery. ? Can cause constipation.  You may need to take these actions to prevent or treat constipation:  Drink enough fluid to keep your urine pale yellow.  Take over-the-counter or prescription medicines.  Eat foods that are high in fiber, such as beans, whole grains, and fresh fruits and vegetables.  Limit foods that are high in fat and processed sugars, such as fried or sweet foods. Lifestyle  Do not drink alcohol.  Do not use any products that contain nicotine or tobacco, such as cigarettes, e-cigarettes, and chewing tobacco. If you need help quitting, ask your health care provider.  Get at least 8 hours of sleep every night.  Find ways to manage stress, such as meditation, deep breathing, or yoga. General instructions  Keep a journal to find out what may trigger your migraine headaches. For example, write down: ? What you eat and drink. ? How much sleep you get. ? Any change to your diet or medicines.  If you have a migraine headache: ? Avoid things that make your symptoms worse, such as bright lights. ? It may help to lie down in a dark, quiet room. ? Do not drive or use heavy machinery. ? Ask your health care provider what activities are safe for you while you are experiencing symptoms.  Keep all follow-up visits as told by your health care provider. This is important.      Contact a health care provider if:  You develop symptoms that are different or more severe than your usual migraine headache symptoms.  You have more than 15 headache days in one month. Get help right away if:  Your migraine headache becomes severe.  Your migraine headache lasts longer than 72 hours.  You have a fever.  You have a stiff neck.  You have vision loss.  Your muscles feel weak or like you cannot control them.  You start to lose your balance often.  You have trouble walking.  You faint.  You have a seizure. Summary  A migraine headache is an intense, throbbing pain on one side or both sides of the  head. Migraines may also cause other symptoms, such as nausea, vomiting, and sensitivity to light and noise.  This condition may be treated with medicines and lifestyle changes. You may also need to avoid certain things that trigger a migraine headache.  Keep a journal to find out what may trigger your migraine headaches.  Contact your health care provider if you have more than 15 headache days in a month or you develop symptoms that are different or more severe than your usual migraine headache symptoms. This information is not intended to replace advice given to you by your health care provider. Make sure you discuss any questions you have with your health care provider. Document Revised: 06/03/2018 Document Reviewed: 03/24/2018 Elsevier Patient Education  2021 [employer].

## 2020-04-13 NOTE — Progress Notes (Signed)
I connected with  Tracy Harris on 04/13/20 by a video enabled telemedicine application and verified that I am speaking with the correct person using two identifiers.   I discussed the limitations of evaluation and management by telemedicine. The patient expressed understanding and agreed to proceed.    New Patient Office Visit  Subjective:  Patient ID: Tracy Harris, female    DOB: Dec 20, 1980  Age: 40 y.o. MRN: [id number]  CC:  Chief Complaint  Patient presents with  . Migraine   Virtual Visit via Video Note  I connected with Tracy Harris on 04/13/20 at 10:15 AM EST by a video enabled telemedicine application and verified that I am speaking with the correct person using two identifiers.  Location: Patient: Home Provider: Working remotely from home   I discussed the limitations of evaluation and management by telemedicine and the availability of in person appointments. The patient expressed understanding and agreed to proceed.  History of Present Illness:  Tracy Harris reports that she has been suffering from a migraine headache since 6:00 yesterday morning. Reports that she has been having sensitivity to light, nausea without vomiting. Reports that she has tried Advil, Tylenol, caffeine, allergy cold and sinus without relief. Reports that she has had migraines in the past but not one recently. Denies any other upper respiratory type symptoms.    Observations/Objective: Medical history and current medications reviewed, no physical exam completed     Past Medical History:  Diagnosis Date  . Anxiety   . Bipolar 1 disorder (HCC)   . Dichorionic diamniotic twin pregnancy in third trimester 05/02/2016  . Endometriosis   . GERD (gastroesophageal reflux disease)   . History of anorexia nervosa    hospitalized age 73 and 29  . History of kidney stones   . History of positive PPD   . Mild asthma   . PONV (postoperative nausea and vomiting)   . Postpartum  care following cesarean delivery Indication: PROM / Di-Di Twins / Repeat (3/10) 05/02/2016  . Premature rupture of membranes 05/02/2016  . Previous cesarean delivery affecting pregnancy 05/02/2016  . Wears contact lenses     Past Surgical History:  Procedure Laterality Date  . CESAREAN SECTION  02/11/2011   Procedure: CESAREAN SECTION;  Surgeon: Serita Kyle, MD;  Location: [facility];  Service: Gynecology;  Laterality: N/A;  . CESAREAN SECTION MULTI-GESTATIONAL N/A 05/02/2016   Procedure: CESAREAN SECTION Multiple Gestation;  Surgeon: Maxie Better, MD;  Location: [facility];  Service: Obstetrics;  Laterality: N/A;  . CHOLECYSTECTOMY  2019  . DX  LAPAROSCOPY/  BX LEFT OVARIAN CYST/  ABLATION AND EXCISION ENDOMETRIOSIS/ LYSIS OF ADHESIONS  03-21-2010   and chromopurtubation  . LAPAROSCOPY N/A 09/20/2013   Procedure: LAPAROSCOPY LYSIS OF ADHESIONS,  EXCISION OF ENDOMETRIOSIS RIGHT OVARIAN CYSTECTOMY.;  Surgeon: Fermin Schwab, MD;  Location: [facility];  Service: Gynecology;  Laterality: N/A;  . LASER ABLATION HPV , VAGINA, CERVIX, PERIANAL , AND VULVA  08-15-2002  . TONSILLECTOMY  2011    History reviewed. No pertinent family history.  Social History   Socioeconomic History  . Marital status: Married    Spouse name: Not on file  . Number of children: Not on file  . Years of education: Not on file  . Highest education level: Not on file  Occupational History  . Not on file  Tobacco Use  . Smoking status: Former Smoker    Packs/day: 1.00    Years: 10.00  Pack years: 10.00    Types: Cigarettes    Quit date: 09/16/2003    Years since quitting: 16.5  . Smokeless tobacco: Never Used  Substance and Sexual Activity  . Alcohol use: Yes    Comment: rare  . Drug use: No  . Sexual activity: Not Currently    Birth control/protection: None  Other Topics Concern  . Not on file  Social History Narrative  . Not on file   Social Determinants of Health    Financial Resource Strain: Not on file  Food Insecurity: Not on file  Transportation Needs: Not on file  Physical Activity: Not on file  Stress: Not on file  Social Connections: Not on file  Intimate Partner Violence: Not on file    ROS Review of Systems  Constitutional: Negative for chills and fever.  HENT: Negative for congestion and sore throat.   Eyes: Positive for photophobia.  Respiratory: Negative for cough and shortness of breath.   Cardiovascular: Negative for chest pain.  Gastrointestinal: Positive for nausea. Negative for abdominal pain and vomiting.  Genitourinary: Negative.   Musculoskeletal: Negative for myalgias.  Skin: Negative.   Allergic/Immunologic: Negative.   Neurological: Positive for headaches. Negative for dizziness, syncope and weakness.  Hematological: Negative.   Psychiatric/Behavioral: Negative.     Objective:   Today's Vitals: There were no vitals taken for this visit.    Assessment & Plan:   Problem List Items Addressed This Visit   None   Visit Diagnoses    Migraine without aura and without status migrainosus, not intractable    -  Primary   Relevant Medications   SUMAtriptan (IMITREX) 50 MG tablet   ondansetron (ZOFRAN ODT) 8 MG disintegrating tablet      Outpatient Encounter Medications as of 04/13/2020  Medication Sig  . ondansetron (ZOFRAN ODT) 8 MG disintegrating tablet Take 1 tablet (8 mg total) by mouth every 8 (eight) hours as needed for nausea or vomiting.  . SUMAtriptan (IMITREX) 50 MG tablet Take 1 tablet (50 mg total) by mouth every 2 (two) hours as needed for migraine. May repeat in 2 hours if headache persists or recurs.  Marland Kitchen albuterol (PROVENTIL HFA;VENTOLIN HFA) 108 (90 Base) MCG/ACT inhaler Inhale 1-2 puffs into the lungs every 6 (six) hours as needed for wheezing or shortness of breath.   Marland Kitchen buPROPion (WELLBUTRIN) 100 MG tablet Take 200-300 mg by mouth 2 (two) times daily. Pt takes two tablets in the morning and three  at bedtime.  . folic acid (FOLVITE) 1 MG tablet Take 1 mg by mouth at bedtime.  . hydrocortisone cream 1 % Apply topically 3 (three) times daily. (Patient not taking: Reported on 05/31/2019)  . ibuprofen (ADVIL,MOTRIN) 800 MG tablet Take 1 tablet (800 mg total) by mouth every 8 (eight) hours. (Patient not taking: Reported on 05/31/2019)  . iron polysaccharides (NIFEREX) 150 MG capsule Take 1 capsule (150 mg total) by mouth daily. (Patient not taking: Reported on 05/31/2019)  . labetalol (NORMODYNE) 100 MG tablet Take 1 tablet (100 mg total) by mouth 2 (two) times daily.  . magnesium oxide (MAG-OX) 400 (241.3 Mg) MG tablet Take 1 tablet (400 mg total) by mouth daily. (Patient not taking: Reported on 05/31/2019)  . nitrofurantoin, macrocrystal-monohydrate, (MACROBID) 100 MG capsule Take 100 mg by mouth daily as needed (after intercourse).   Marland Kitchen oxyCODONE (OXY IR/ROXICODONE) 5 MG immediate release tablet Take 1 tablet (5 mg total) by mouth every 4 (four) hours as needed for severe pain. (Patient not taking:  Reported on 05/31/2019)  . pantoprazole (PROTONIX) 40 MG tablet Take 40 mg by mouth daily.  . Prenatal MV-Min-FA-Omega-3 (PRENATAL GUMMIES/DHA & FA) 0.4-32.5 MG CHEW Chew 2 each by mouth at bedtime.  Marland Kitchen QUEtiapine (SEROQUEL XR) 300 MG 24 hr tablet Take 600 mg by mouth at bedtime.   . sucralfate (CARAFATE) 1 g tablet Take 1 tablet (1 g total) by mouth 4 (four) times daily -  with meals and at bedtime. (Patient not taking: Reported on 05/31/2019)  . valACYclovir (VALTREX) 500 MG tablet Take 500 mg by mouth at bedtime.   Marland Kitchen zolpidem (AMBIEN) 10 MG tablet Take 10 mg by mouth at bedtime as needed for sleep.   . [DISCONTINUED] ondansetron (ZOFRAN ODT) 4 MG disintegrating tablet Take 1 tablet (4 mg total) by mouth every 8 (eight) hours as needed for nausea or vomiting.   No facility-administered encounter medications on file as of 04/13/2020.    Assessment and Plan:   1. Migraine without aura and without status  migrainosus, not intractable Trial Imitrex, Zofran, patient education given on increased hydration, rest. Red flags given for prompt reevaluation. - SUMAtriptan (IMITREX) 50 MG tablet; Take 1 tablet (50 mg total) by mouth every 2 (two) hours as needed for migraine. May repeat in 2 hours if headache persists or recurs.  Dispense: 10 tablet; Refill: 0 - ondansetron (ZOFRAN ODT) 8 MG disintegrating tablet; Take 1 tablet (8 mg total) by mouth every 8 (eight) hours as needed for nausea or vomiting.  Dispense: 20 tablet; Refill: 0   Follow Up Instructions:    I discussed the assessment and treatment plan with the patient. The patient was provided an opportunity to ask questions and all were answered. The patient agreed with the plan and demonstrated an understanding of the instructions.   The patient was advised to call back or seek an in-person evaluation if the symptoms worsen or if the condition fails to improve as anticipated.  I provided 21  minutes of face-to-face and non-face-to-face time during this encounter.   Follow-up: Return if symptoms worsen or fail to improve.   Kasandra Knudsen Mayers, PA-C

## 2020-05-25 ENCOUNTER — Telehealth: Payer: BC Managed Care – PPO | Admitting: Physician Assistant

## 2020-05-25 ENCOUNTER — Encounter: Payer: Self-pay | Admitting: Physician Assistant

## 2020-05-25 DIAGNOSIS — J45901 Unspecified asthma with (acute) exacerbation: Secondary | ICD-10-CM | POA: Diagnosis not present

## 2020-05-25 DIAGNOSIS — R059 Cough, unspecified: Secondary | ICD-10-CM

## 2020-05-25 DIAGNOSIS — J440 Chronic obstructive pulmonary disease with acute lower respiratory infection: Secondary | ICD-10-CM | POA: Diagnosis not present

## 2020-05-25 DIAGNOSIS — J209 Acute bronchitis, unspecified: Secondary | ICD-10-CM

## 2020-05-25 DIAGNOSIS — Z20822 Contact with and (suspected) exposure to covid-19: Secondary | ICD-10-CM | POA: Diagnosis not present

## 2020-05-25 MED ORDER — AZITHROMYCIN 250 MG PO TABS
ORAL_TABLET | ORAL | 0 refills | Status: DC
Start: 2020-05-25 — End: 2020-11-09

## 2020-05-25 MED ORDER — PSEUDOEPH-BROMPHEN-DM 30-2-10 MG/5ML PO SYRP: 5.0000 mL | ORAL_SOLUTION | Freq: Three times a day (TID) | ORAL | 0 refills | Status: DC | PRN

## 2020-05-25 MED ORDER — ALBUTEROL SULFATE HFA 108 (90 BASE) MCG/ACT IN AERS: 2.0000 | INHALATION_SPRAY | Freq: Four times a day (QID) | RESPIRATORY_TRACT | 0 refills | Status: DC | PRN

## 2020-05-25 MED ORDER — PREDNISONE 20 MG PO TABS: 20.0000 mg | ORAL_TABLET | Freq: Every day | ORAL | 0 refills | Status: DC

## 2020-05-25 NOTE — Progress Notes (Signed)
Virtual Visit via Video Note  I connected with Tracy Harris on 05/25/20 at  9:45 AM EDT by a video enabled telemedicine application and verified that I am speaking with the correct person using two identifiers.  Location: Patient: home        Provider: provider's office    I discussed the limitations of evaluation and management by telemedicine and the availability of in person appointments. The patient expressed understanding and agreed to proceed.  Person participating in the virtual visit: patient and provider    I discussed the assessment and treatment plan with the patient. The patient was provided an opportunity to ask questions and all were answered. The patient agreed with the plan and demonstrated an understanding of the instructions.   The patient was advised to call back or seek an in-person evaluation if the symptoms worsen or if the condition fails to improve as anticipated.  I provided  10 minutes of non-face-to-face time during this encounter.   Demetrio Lapping, PA-C    Acute Office Visit  Subjective:    Patient ID: Tracy Harris, female    DOB: 1980-07-15, 40 y.o.   MRN: [id number]  No chief complaint on file.   40 yo F in NAD with PMH of Asthma, on proair rescure inhaler  as needed, has cough x 3 weeks. States the cough is productive, and is associated with chest congestion. Has been wheezing x 3 days. Uses the rescue inhaler without relief- states inhaler has expired since last year.  Denies any known covid/flu exposure or any other sick contacts. Has had 2 Covid 19 vaccinations.   Cough This is a recurrent problem. The current episode started 1 to 4 weeks ago. The problem has been gradually worsening. The cough is productive of sputum. Associated symptoms include wheezing. Pertinent negatives include no chest pain, chills, ear congestion, ear pain, fever, headaches, heartburn, hemoptysis, myalgias, nasal congestion, postnasal drip, rash, rhinorrhea, sore  throat, shortness of breath, sweats or weight loss. Nothing aggravates the symptoms. She has tried OTC cough suppressant and a beta-agonist inhaler for the symptoms. The treatment provided mild relief. Her past medical history is significant for asthma. There is no history of environmental allergies.   Patient is in today for cough, wheezing  Past Medical History:  Diagnosis Date  . Anxiety   . Bipolar 1 disorder (HCC)   . Dichorionic diamniotic twin pregnancy in third trimester 05/02/2016  . Endometriosis   . GERD (gastroesophageal reflux disease)   . History of anorexia nervosa    hospitalized age 35 and 53  . History of kidney stones   . History of positive PPD   . Mild asthma   . PONV (postoperative nausea and vomiting)   . Postpartum care following cesarean delivery Indication: PROM / Di-Di Twins / Repeat (3/10) 05/02/2016  . Premature rupture of membranes 05/02/2016  . Previous cesarean delivery affecting pregnancy 05/02/2016  . Wears contact lenses     Past Surgical History:  Procedure Laterality Date  . CESAREAN SECTION  02/11/2011   Procedure: CESAREAN SECTION;  Surgeon: Serita Kyle, MD;  Location: [facility];  Service: Gynecology;  Laterality: N/A;  . CESAREAN SECTION MULTI-GESTATIONAL N/A 05/02/2016   Procedure: CESAREAN SECTION Multiple Gestation;  Surgeon: Maxie Better, MD;  Location: [facility];  Service: Obstetrics;  Laterality: N/A;  . CHOLECYSTECTOMY  2019  . DX  LAPAROSCOPY/  BX LEFT OVARIAN CYST/  ABLATION AND EXCISION ENDOMETRIOSIS/ LYSIS OF ADHESIONS  03-21-2010  and chromopurtubation  . LAPAROSCOPY N/A 09/20/2013   Procedure: LAPAROSCOPY LYSIS OF ADHESIONS,  EXCISION OF ENDOMETRIOSIS RIGHT OVARIAN CYSTECTOMY.;  Surgeon: Fermin Schwabamer Yalcinkaya, MD;  Location: [facility];  Service: Gynecology;  Laterality: N/A;  . LASER ABLATION HPV , VAGINA, CERVIX, PERIANAL , AND VULVA  08-15-2002  . TONSILLECTOMY  2011    History reviewed. No  pertinent family history.  Social History   Socioeconomic History  . Marital status: Married    Spouse name: Not on file  . Number of children: Not on file  . Years of education: Not on file  . Highest education level: Not on file  Occupational History  . Not on file  Tobacco Use  . Smoking status: Former Smoker    Packs/day: 1.00    Years: 10.00    Pack years: 10.00    Types: Cigarettes    Quit date: 09/16/2003    Years since quitting: 16.7  . Smokeless tobacco: Never Used  Substance and Sexual Activity  . Alcohol use: Yes    Comment: rare  . Drug use: No  . Sexual activity: Not Currently    Birth control/protection: None  Other Topics Concern  . Not on file  Social History Narrative  . Not on file   Social Determinants of Health   Financial Resource Strain: Not on file  Food Insecurity: Not on file  Transportation Needs: Not on file  Physical Activity: Not on file  Stress: Not on file  Social Connections: Not on file  Intimate Partner Violence: Not on file    Outpatient Medications Prior to Visit  Medication Sig Dispense Refill  . albuterol (PROVENTIL HFA;VENTOLIN HFA) 108 (90 Base) MCG/ACT inhaler Inhale 1-2 puffs into the lungs every 6 (six) hours as needed for wheezing or shortness of breath.     . folic acid (FOLVITE) 1 MG tablet Take 1 mg by mouth at bedtime.    Marland Kitchen. labetalol (NORMODYNE) 100 MG tablet Take 1 tablet (100 mg total) by mouth 2 (two) times daily. 60 tablet 1  . nitrofurantoin, macrocrystal-monohydrate, (MACROBID) 100 MG capsule Take 100 mg by mouth daily as needed (after intercourse).     . ondansetron (ZOFRAN ODT) 8 MG disintegrating tablet Take 1 tablet (8 mg total) by mouth every 8 (eight) hours as needed for nausea or vomiting. 20 tablet 0  . pantoprazole (PROTONIX) 40 MG tablet Take 40 mg by mouth daily.    . QUEtiapine (SEROQUEL XR) 300 MG 24 hr tablet Take 600 mg by mouth at bedtime.     . SUMAtriptan (IMITREX) 50 MG tablet Take 1 tablet (50  mg total) by mouth every 2 (two) hours as needed for migraine. May repeat in 2 hours if headache persists or recurs. 10 tablet 0  . valACYclovir (VALTREX) 500 MG tablet Take 500 mg by mouth at bedtime.     Marland Kitchen. zolpidem (AMBIEN) 10 MG tablet Take 10 mg by mouth at bedtime as needed for sleep.      No facility-administered medications prior to visit.    Allergies  Allergen Reactions  . Ciprofloxacin Nausea Only  . Other Swelling and Other (See Comments)    Pt is allergic to seprafilm medication used in abdomen during last laparoscopy surgery. Reaction:  Extreme abdominal swelling and pain    Review of Systems  Constitutional: Negative for activity change, appetite change, chills, diaphoresis, fatigue, fever and weight loss.  HENT: Negative for congestion, ear pain, postnasal drip, rhinorrhea, sinus pressure, sinus pain, sneezing,  sore throat and voice change.   Respiratory: Positive for cough and wheezing. Negative for hemoptysis and shortness of breath.   Cardiovascular: Negative for chest pain.  Gastrointestinal: Negative for abdominal pain, diarrhea, heartburn, nausea and vomiting.  Musculoskeletal: Negative for myalgias.  Skin: Negative for rash.  Allergic/Immunologic: Negative for environmental allergies.  Neurological: Negative for headaches.       Objective:    Physical Exam Constitutional:      General: She is not in acute distress.    Appearance: Normal appearance. She is not ill-appearing, toxic-appearing or diaphoretic.     Comments: coughing  HENT:     Head: Normocephalic and atraumatic.  Musculoskeletal:     Cervical back: Normal range of motion and neck supple.  Neurological:     Mental Status: She is alert.  Psychiatric:        Mood and Affect: Mood normal.        Thought Content: Thought content normal.     There were no vitals taken for this visit. Wt Readings from Last 3 Encounters:  05/02/16 191 lb (86.6 kg)  04/14/16 188 lb 2 oz (85.3 kg)  03/26/16  185 lb 8 oz (84.1 kg)    Health Maintenance Due  Topic Date Due  . Hepatitis C Screening  Never done  . COVID-19 Vaccine (1) Never done  . PAP SMEAR-Modifier  05/27/2013    There are no preventive care reminders to display for this patient.   No results found for: TSH Lab Results  Component Value Date   WBC 8.0 07/30/2017   HGB 13.4 07/30/2017   HCT 40.8 07/30/2017   MCV 94.2 07/30/2017   PLT 224 07/30/2017   Lab Results  Component Value Date   NA 138 07/30/2017   K 4.8 07/30/2017   CO2 25 07/30/2017   GLUCOSE 112 (H) 07/30/2017   BUN 9 07/30/2017   CREATININE 0.87 07/30/2017   BILITOT 0.5 07/30/2017   ALKPHOS 63 07/30/2017   AST 36 07/30/2017   ALT 30 07/30/2017   PROT 7.2 07/30/2017   ALBUMIN 4.0 07/30/2017   CALCIUM 9.1 07/30/2017   ANIONGAP 6 07/30/2017   No results found for: CHOL No results found for: HDL No results found for: LDLCALC No results found for: TRIG No results found for: CHOLHDL No results found for: CHYI5O     Assessment & Plan:   Problem List Items Addressed This Visit   None   Visit Diagnoses    Exacerbation of asthma, unspecified asthma severity, unspecified whether persistent    -  Primary   Relevant Medications   predniSONE (DELTASONE) 20 MG tablet   albuterol (VENTOLIN HFA) 108 (90 Base) MCG/ACT inhaler   Cough       Relevant Medications   brompheniramine-pseudoephedrine-DM 30-2-10 MG/5ML syrup   Suspected COVID-19 virus infection       Relevant Orders   MyChart COVID-19 home monitoring program   Acute bronchitis with COPD (HCC)       Relevant Medications   predniSONE (DELTASONE) 20 MG tablet   albuterol (VENTOLIN HFA) 108 (90 Base) MCG/ACT inhaler   brompheniramine-pseudoephedrine-DM 30-2-10 MG/5ML syrup   azithromycin (ZITHROMAX) 250 MG tablet       Meds ordered this encounter  Medications  . predniSONE (DELTASONE) 20 MG tablet    Sig: Take 1 tablet (20 mg total) by mouth daily with breakfast.    Dispense:  10  tablet    Refill:  0    Order Specific Question:  Supervising Provider    Answer:   Eber Hong [[id number]]  . albuterol (VENTOLIN HFA) 108 (90 Base) MCG/ACT inhaler    Sig: Inhale 2 puffs into the lungs every 6 (six) hours as needed for wheezing or shortness of breath.    Dispense:  8 g    Refill:  0    Order Specific Question:   Supervising Provider    Answer:   MILLER, BRIAN [[id number]]  . brompheniramine-pseudoephedrine-DM 30-2-10 MG/5ML syrup    Sig: Take 5 mLs by mouth 3 (three) times daily as needed.    Dispense:  120 mL    Refill:  0    Order Specific Question:   Supervising Provider    Answer:   MILLER, BRIAN [[id number]]  . azithromycin (ZITHROMAX) 250 MG tablet    Sig: Take 2 tablets today, take one table days 2-5    Dispense:  6 tablet    Refill:  0    Order Specific Question:   Supervising Provider    Answer:   Eber Hong [[id number]]    Your current symptoms could be consistent with the coronavirus.  Many health care providers can now test patients at their office but not all are.  [facility] has multiple testing sites. For information on our COVID testing locations and hours go to [email or web address]  We are enrolling you in our MyChart Home Monitoring for COVID19 . Daily you will receive a questionnaire within the MyChart website. Our COVID 19 response team will be monitoring your responses daily.  Testing Information: The COVID-19 Community Testing sites are testing BY APPOINTMENT ONLY.  You can schedule online at [email or web address]  If you do not have access to a smart phone or computer you may call [phone] for an appointment.   Additional testing sites in the Community:  . For [facility] Testing sites in [facility]  [email or web address]  . For Pop-up testing sites in [place]  [email or web address]  . For [facility]  [email or web address]  . For [facility] testing in [place] and [employer] [email or web address]  . For [facility] testing in [facility]   [email or web address]  For  more information about community testing call [phone]   Please quarantine yourself while awaiting your test results. Please stay home for a minimum of 10 days from the first day of illness with improving symptoms and you have had 24 hours of no fever (without the use of Tylenol (Acetaminophen) Motrin (Ibuprofen) or any fever reducing medication).  Also - Do not get tested prior to returning to work because once you have had a positive test the test can stay positive for more than a month in some cases.   You should wear a mask or cloth face covering over your nose and mouth if you must be around other people or animals, including pets (even at home). Try to stay at least 6 feet away from other people. This will protect the people around you.  Please continue good preventive care measures, including:  frequent hand-washing, avoid touching your face, cover coughs/sneezes, stay out of crowds and keep a 6 foot distance from others.  COVID-19 is a respiratory illness with symptoms that are similar to the flu. Symptoms are typically mild to moderate, but there have been cases of severe illness and death due to the virus.   The following symptoms may appear 2-14 days after exposure: . Fever . Cough . Shortness of breath or difficulty breathing . Chills . Repeated shaking with chills . Muscle pain .  Headache . Sore throat . New loss of taste or smell . Fatigue . Congestion or runny nose . Nausea or vomiting . Diarrhea  Go to the nearest hospital ED for assessment if fever/cough/breathlessness are severe or illness seems  like a threat to life.  It is vitally important that if you feel that you have an infection such as this virus or any other virus that you stay home and away from places where you may spread it to others.  You should avoid contact with people age 52 and older.      Demetrio Lapping, PA-C

## 2020-11-09 ENCOUNTER — Telehealth: Payer: BC Managed Care – PPO | Admitting: Physician Assistant

## 2020-11-09 DIAGNOSIS — G43009 Migraine without aura, not intractable, without status migrainosus: Secondary | ICD-10-CM

## 2020-11-09 DIAGNOSIS — U071 COVID-19: Secondary | ICD-10-CM

## 2020-11-09 MED ORDER — FLUTICASONE-SALMETEROL 250-50 MCG/ACT IN AEPB: 1.0000 | INHALATION_SPRAY | Freq: Two times a day (BID) | RESPIRATORY_TRACT | 0 refills | Status: AC

## 2020-11-09 MED ORDER — SUMATRIPTAN SUCCINATE 50 MG PO TABS: 50.0000 mg | ORAL_TABLET | Freq: Once | ORAL | 0 refills | Status: DC

## 2020-11-09 MED ORDER — MOLNUPIRAVIR EUA 200MG CAPSULE: 4.0000 | ORAL_CAPSULE | Freq: Two times a day (BID) | ORAL | 0 refills | Status: AC

## 2020-11-09 MED ORDER — BENZONATATE 100 MG PO CAPS
100.0000 mg | ORAL_CAPSULE | Freq: Three times a day (TID) | ORAL | 0 refills | Status: DC | PRN
Start: 2020-11-09 — End: 2021-02-04

## 2020-11-09 NOTE — Patient Instructions (Signed)
[name], thank you for joining Tracy Climes, PA-C for today's virtual visit.  While this provider is not your primary care provider (PCP), if your PCP is located in our provider database this encounter information will be shared with them immediately following your visit.  Consent: (Patient) Tracy Harris provided verbal consent for this virtual visit at the beginning of the encounter.  Current Medications:  Current Outpatient Medications:    albuterol (PROVENTIL HFA;VENTOLIN HFA) 108 (90 Base) MCG/ACT inhaler, Inhale 1-2 puffs into the lungs every 6 (six) hours as needed for wheezing or shortness of breath. , Disp: , Rfl:    albuterol (VENTOLIN HFA) 108 (90 Base) MCG/ACT inhaler, Inhale 2 puffs into the lungs every 6 (six) hours as needed for wheezing or shortness of breath., Disp: 8 g, Rfl: 0   azithromycin (ZITHROMAX) 250 MG tablet, Take 2 tablets today, take one table days 2-5, Disp: 6 tablet, Rfl: 0   brompheniramine-pseudoephedrine-DM 30-2-10 MG/5ML syrup, Take 5 mLs by mouth 3 (three) times daily as needed., Disp: 120 mL, Rfl: 0   folic acid (FOLVITE) 1 MG tablet, Take 1 mg by mouth at bedtime., Disp: , Rfl:    labetalol (NORMODYNE) 100 MG tablet, Take 1 tablet (100 mg total) by mouth 2 (two) times daily., Disp: 60 tablet, Rfl: 1   nitrofurantoin, macrocrystal-monohydrate, (MACROBID) 100 MG capsule, Take 100 mg by mouth daily as needed (after intercourse). , Disp: , Rfl:    ondansetron (ZOFRAN ODT) 8 MG disintegrating tablet, Take 1 tablet (8 mg total) by mouth every 8 (eight) hours as needed for nausea or vomiting., Disp: 20 tablet, Rfl: 0   pantoprazole (PROTONIX) 40 MG tablet, Take 40 mg by mouth daily., Disp: , Rfl:    predniSONE (DELTASONE) 20 MG tablet, Take 1 tablet (20 mg total) by mouth daily with breakfast., Disp: 10 tablet, Rfl: 0   QUEtiapine (SEROQUEL XR) 300 MG 24 hr tablet, Take 600 mg by mouth at bedtime. , Disp: , Rfl:    SUMAtriptan (IMITREX) 50  MG tablet, Take 1 tablet (50 mg total) by mouth every 2 (two) hours as needed for migraine. May repeat in 2 hours if headache persists or recurs., Disp: 10 tablet, Rfl: 0   valACYclovir (VALTREX) 500 MG tablet, Take 500 mg by mouth at bedtime. , Disp: , Rfl:    zolpidem (AMBIEN) 10 MG tablet, Take 10 mg by mouth at bedtime as needed for sleep. , Disp: , Rfl:    Medications ordered in this encounter:  No orders of the defined types were placed in this encounter.    *If you need refills on other medications prior to your next appointment, please contact your pharmacy*  Follow-Up: Call back or seek an in-person evaluation if the symptoms worsen or if the condition fails to improve as anticipated.  Other Instructions Please keep well-hydrated and get plenty of rest. Start a saline nasal rinse to flush out your nasal passages. You can use plain Mucinex to help thin congestion. If you have a humidifier, running in the bedroom at night. I want you to start OTC vitamin D3 1000 units daily, vitamin C 1000 mg daily, and a zinc supplement. Please take prescribed medications as directed.  You have been enrolled in a MyChart symptom monitoring program. Please answer these questions daily so we can keep track of how you are doing.  You were to quarantine for 5 days from onset of your symptoms.  After day 5, if you have had  no fever and you are feeling better, you can end quarantine but need to mask for an additional 5 days. After day 5 if you have a fever or are having significant symptoms, please quarantine for full 10 days.  If you note any worsening of symptoms, any significant shortness of breath or any chest pain, please seek ER evaluation ASAP.  Please do not delay care!  COVID-19: What to Do if You Are Sick If you test positive and are an older adult or someone who is at high risk of getting very sick from COVID-19, treatment may be available. Contact a healthcare provider right away after a  positive test to determine if you are eligible, even if your symptoms are mild right now. You can also visit a Test to Treat location and, if eligible, receive a prescription from a provider. Don't delay: Treatment must be started within the first few days to be effective. If you have a fever, cough, or other symptoms, you might have COVID-19. Most people have mild illness and are able to recover at home. If you are sick: Keep track of your symptoms. If you have an emergency warning sign (including trouble breathing), call 911. Steps to help prevent the spread of COVID-19 if you are sick If you are sick with COVID-19 or think you might have COVID-19, follow the steps below to care for yourself and to help protect other people in your home and community. Stay home except to get medical care Stay home. Most people with COVID-19 have mild illness and can recover at home without medical care. Do not leave your home, except to get medical care. Do not visit public areas and do not go to places where you are unable to wear a mask. Take care of yourself. Get rest and stay hydrated. Take over-the-counter medicines, such as acetaminophen, to help you feel better. Stay in touch with your doctor. Call before you get medical care. Be sure to get care if you have trouble breathing, or have any other emergency warning signs, or if you think it is an emergency. Avoid public transportation, ride-sharing, or taxis if possible. Get tested If you have symptoms of COVID-19, get tested. While waiting for test results, stay away from others, including staying apart from those living in your household. Get tested as soon as possible after your symptoms start. Treatments may be available for people with COVID-19 who are at risk for becoming very sick. Don't delay: Treatment must be started early to be effective--some treatments must begin within 5 days of your first symptoms. Contact your healthcare provider right away if your  test result is positive to determine if you are eligible. Self-tests are one of several options for testing for the virus that causes COVID-19 and may be more convenient than laboratory-based tests and point-of-care tests. Ask your healthcare provider or your local health department if you need help interpreting your test results. You can visit your state, tribal, local, and territorial health department's website to look for the latest local information on testing sites. Separate yourself from other people As much as possible, stay in a specific room and away from other people and pets in your home. If possible, you should use a separate bathroom. If you need to be around other people or animals in or outside of the home, wear a well-fitting mask. Tell your close contacts that they may have been exposed to COVID-19. An infected person can spread COVID-19 starting 48 hours (or 2 days) before  the person has any symptoms or tests positive. By letting your close contacts know they may have been exposed to COVID-19, you are helping to protect everyone. See COVID-19 and Animals if you have questions about pets. If you are diagnosed with COVID-19, someone from the health department may call you. Answer the call to slow the spread. Monitor your symptoms Symptoms of COVID-19 include fever, cough, or other symptoms. Follow care instructions from your healthcare provider and local health department. Your local health authorities may give instructions on checking your symptoms and reporting information. When to seek emergency medical attention Look for emergency warning signs* for COVID-19. If someone is showing any of these signs, seek emergency medical care immediately: Trouble breathing Persistent pain or pressure in the chest New confusion Inability to wake or stay awake Pale, gray, or blue-colored skin, lips, or nail beds, depending on skin tone *This list is not all possible symptoms. Please call your  medical provider for any other symptoms that are severe or concerning to you. Call 911 or call ahead to your local emergency facility: Notify the operator that you are seeking care for someone who has or may have COVID-19. Call ahead before visiting your doctor Call ahead. Many medical visits for routine care are being postponed or done by phone or telemedicine. If you have a medical appointment that cannot be postponed, call your doctor's office, and tell them you have or may have COVID-19. This will help the office protect themselves and other patients. If you are sick, wear a well-fitting mask You should wear a mask if you must be around other people or animals, including pets (even at home). Wear a mask with the best fit, protection, and comfort for you. You don't need to wear the mask if you are alone. If you can't put on a mask (because of trouble breathing, for example), cover your coughs and sneezes in some other way. Try to stay at least 6 feet away from other people. This will help protect the people around you. Masks should not be placed on young children under age 68 years, anyone who has trouble breathing, or anyone who is not able to remove the mask without help. Cover your coughs and sneezes Cover your mouth and nose with a tissue when you cough or sneeze. Throw away used tissues in a lined trash can. Immediately wash your hands with soap and water for at least 20 seconds. If soap and water are not available, clean your hands with an alcohol-based hand sanitizer that contains at least 60% alcohol. Clean your hands often Wash your hands often with soap and water for at least 20 seconds. This is especially important after blowing your nose, coughing, or sneezing; going to the bathroom; and before eating or preparing food. Use hand sanitizer if soap and water are not available. Use an alcohol-based hand sanitizer with at least 60% alcohol, covering all surfaces of your hands and rubbing  them together until they feel dry. Soap and water are the best option, especially if hands are visibly dirty. Avoid touching your eyes, nose, and mouth with unwashed hands. Handwashing Tips Avoid sharing personal household items Do not share dishes, drinking glasses, cups, eating utensils, towels, or bedding with other people in your home. Wash these items thoroughly after using them with soap and water or put in the dishwasher. Clean surfaces in your home regularly Clean and disinfect high-touch surfaces (for example, doorknobs, tables, handles, light switches, and countertops) in your "sick room" and  bathroom. In shared spaces, you should clean and disinfect surfaces and items after each use by the person who is ill. If you are sick and cannot clean, a caregiver or other person should only clean and disinfect the area around you (such as your bedroom and bathroom) on an as needed basis. Your caregiver/other person should wait as long as possible (at least several hours) and wear a mask before entering, cleaning, and disinfecting shared spaces that you use. Clean and disinfect areas that may have blood, stool, or body fluids on them. Use household cleaners and disinfectants. Clean visible dirty surfaces with household cleaners containing soap or detergent. Then, use a household disinfectant. Use a product from [employer] List N: Disinfectants for Coronavirus (CWCBJ-62). Be sure to follow the instructions on the label to ensure safe and effective use of the product. Many products recommend keeping the surface wet with a disinfectant for a certain period of time (look at "contact time" on the product label). You may also need to wear personal protective equipment, such as gloves, depending on the directions on the product label. Immediately after disinfecting, wash your hands with soap and water for 20 seconds. For completed guidance on cleaning and disinfecting your home, visit Complete Disinfection  Guidance. Take steps to improve ventilation at home Improve ventilation (air flow) at home to help prevent from spreading COVID-19 to other people in your household. Clear out COVID-19 virus particles in the air by opening windows, using air filters, and turning on fans in your home. Use this interactive tool to learn how to improve air flow in your home. When you can be around others after being sick with COVID-19 Deciding when you can be around others is different for different situations. Find out when you can safely end home isolation. For any additional questions about your care, contact your healthcare provider or state or local health department. 05/14/2020 Content source: [facility] ([facility]), Division of Viral Diseases This information is not intended to replace advice given to you by your health care provider. Make sure you discuss any questions you have with your health care provider. Document Revised: 06/27/2020 Document Reviewed: 06/27/2020 Elsevier Patient Education  2022 [employer].      If you have been instructed to have an in-person evaluation today at a local Urgent Care facility, please use the link below. It will take you to a list of all of our available [facility], including address, phone number and hours of operation. Please do not delay care.  [facility]  If you or a family member do not have a primary care provider, use the link below to schedule a visit and establish care. When you choose a [facility] primary care physician or advanced practice provider, you gain a long-term partner in health. Find a Primary Care Provider  Learn more about 's in-office and virtual care options: [facility]

## 2020-11-09 NOTE — Progress Notes (Signed)
Virtual Visit Consent   GODDESS GEBBIA, you are scheduled for a virtual visit with a [facility] provider today.     Just as with appointments in the office, your consent must be obtained to participate.  Your consent will be active for this visit and any virtual visit you may have with one of our providers in the next 365 days.     If you have a MyChart account, a copy of this consent can be sent to you electronically.  All virtual visits are billed to your insurance company just like a traditional visit in the office.    As this is a virtual visit, video technology does not allow for your provider to perform a traditional examination.  This may limit your provider's ability to fully assess your condition.  If your provider identifies any concerns that need to be evaluated in person or the need to arrange testing (such as labs, EKG, etc.), we will make arrangements to do so.     Although advances in technology are sophisticated, we cannot ensure that it will always work on either your end or our end.  If the connection with a video visit is poor, the visit may have to be switched to a telephone visit.  With either a video or telephone visit, we are not always able to ensure that we have a secure connection.     I need to obtain your verbal consent now.   Are you willing to proceed with your visit today?    ALVIA JABLONSKI has provided verbal consent on 11/09/2020 for a virtual visit (video or telephone).   [name], [place]   Date: 11/09/2020 8:35 AM   Virtual Visit via Video Note   I, [name], connected with  YAZMEEN WOOLF  ([id number], 07-24-1980) on 11/09/20 at  8:45 AM EDT by a video-enabled telemedicine application and verified that I am speaking with the correct person using two identifiers.  Location: Patient: Virtual Visit Location Patient: Home Provider: Virtual Visit Location Provider: Home Office   I discussed the limitations of evaluation and  management by telemedicine and the availability of in person appointments. The patient expressed understanding and agreed to proceed.    History of Present Illness: SANAII CAPORASO is a 40 y.o. who identifies as a female who was assigned female at birth, and is being seen today for COVID-19, testing positive on at-home test yesterday. Symptoms started yesterday with fever, sore throat, chest tightness, dry cough. Denies chest pain. Some chest wall-tenderness with coughing. Denies loss of taste or smell. Notes significant headache. Has tried to take Muciinex AM. Has also taken Advil to help with headaches.   HPI: HPI  Problems:  Patient Active Problem List   Diagnosis Date Noted   Allergic rhinitis due to pollen 04/13/2020   Anxiety 04/13/2020   Nephrolithiasis 04/13/2020   Sciatica 04/13/2020   Gastroesophageal reflux disease without esophagitis 06/01/2019   Herpes genitalis in women 06/01/2019   Intermittent asthma, uncomplicated 06/01/2019   Displacement of lumbar intervertebral disc without myelopathy 06/01/2017   Premature rupture of membranes 05/02/2016   Dichorionic diamniotic twin pregnancy in third trimester 05/02/2016   Previous cesarean delivery affecting pregnancy 05/02/2016   Postpartum care s/p repeat c-section: twins (3/10) 05/02/2016   Asthma, severe persistent 03/06/2013   Moderate persistent asthma 06/13/2012   Bipolar 1 disorder (HCC) 08/24/2010    Allergies:  Allergies  Allergen Reactions   Ciprofloxacin Nausea Only   Other Swelling  and Other (See Comments)    Pt is allergic to seprafilm medication used in abdomen during last laparoscopy surgery. Reaction:  Extreme abdominal swelling and pain   Medications:  Current Outpatient Medications:    ALPRAZolam (XANAX) 0.5 MG tablet, TAKE ONE TABLET BY MOUTH THREE TIMES A DAY AS NEEDED FOR SLEEP, Disp: , Rfl:    benzonatate (TESSALON) 100 MG capsule, Take 1 capsule (100 mg total) by mouth 3 (three) times daily as  needed for cough., Disp: 30 capsule, Rfl: 0   molnupiravir EUA (LAGEVRIO) 200 mg CAPS capsule, Take 4 capsules (800 mg total) by mouth 2 (two) times daily for 5 days., Disp: 40 capsule, Rfl: 0   albuterol (VENTOLIN HFA) 108 (90 Base) MCG/ACT inhaler, Inhale 2 puffs into the lungs every 6 (six) hours as needed for wheezing or shortness of breath., Disp: 8 g, Rfl: 0   buPROPion (WELLBUTRIN XL) 150 MG 24 hr tablet, bupropion HCl XL 150 mg 24 hr tablet, extended release, Disp: , Rfl:    buPROPion (WELLBUTRIN XL) 300 MG 24 hr tablet, Take 300 mg by mouth daily., Disp: , Rfl:    celecoxib (CELEBREX) 200 MG capsule, Take by mouth 2 (two) times daily., Disp: , Rfl:    etonogestrel-ethinyl estradiol (NUVARING) 0.12-0.015 MG/24HR vaginal ring, Place vaginally., Disp: , Rfl:    fluticasone-salmeterol (ADVAIR) 250-50 MCG/ACT AEPB, Inhale 1 puff into the lungs in the morning and at bedtime., Disp: 60 each, Rfl: 0   folic acid (FOLVITE) 1 MG tablet, Take 1 mg by mouth at bedtime., Disp: , Rfl:    labetalol (NORMODYNE) 100 MG tablet, Take 1 tablet (100 mg total) by mouth 2 (two) times daily., Disp: 60 tablet, Rfl: 1   lamoTRIgine (LAMICTAL) 100 MG tablet, Take 100 mg by mouth daily., Disp: , Rfl:    ondansetron (ZOFRAN ODT) 8 MG disintegrating tablet, Take 1 tablet (8 mg total) by mouth every 8 (eight) hours as needed for nausea or vomiting., Disp: 20 tablet, Rfl: 0   pantoprazole (PROTONIX) 40 MG tablet, Take 40 mg by mouth daily., Disp: , Rfl:    spironolactone (ALDACTONE) 50 MG tablet, Take 50 mg by mouth 2 (two) times daily., Disp: , Rfl:    SUMAtriptan (IMITREX) 50 MG tablet, Take 1 tablet (50 mg total) by mouth every 2 (two) hours as needed for migraine. May repeat in 2 hours if headache persists or recurs., Disp: 10 tablet, Rfl: 0  Observations/Objective: Patient is well-developed, well-nourished in no acute distress.  Resting comfortably at home.  Head is normocephalic, atraumatic.  No labored  breathing. Speech is clear and coherent with logical content.  Patient is alert and oriented at baseline.   Assessment and Plan: 1. COVID-19 - benzonatate (TESSALON) 100 MG capsule; Take 1 capsule (100 mg total) by mouth 3 (three) times daily as needed for cough.  Dispense: 30 capsule; Refill: 0 - fluticasone-salmeterol (ADVAIR) 250-50 MCG/ACT AEPB; Inhale 1 puff into the lungs in the morning and at bedtime.  Dispense: 60 each; Refill: 0 - molnupiravir EUA (LAGEVRIO) 200 mg CAPS capsule; Take 4 capsules (800 mg total) by mouth 2 (two) times daily for 5 days.  Dispense: 40 capsule; Refill: 0 - MyChart COVID-19 home monitoring program; Future Patient with multiple risk factors for complicated course of illness. Discussed risks/benefits of antiviral medications including most common potential ADRs. Patient voiced understanding and would like to proceed with antiviral medication. They are candidate for molnupiravir. Rx sent to pharmacy. Supportive measures, OTC medications and vitamin  regimen reviewed. Tessalon, . Patient has been enrolled in a MyChart COVID symptom monitoring program. Anne Shutter reviewed in detail. Strict ER precautions discussed with patient.    Follow Up Instructions: I discussed the assessment and treatment plan with the patient. The patient was provided an opportunity to ask questions and all were answered. The patient agreed with the plan and demonstrated an understanding of the instructions.  A copy of instructions were sent to the patient via MyChart.  The patient was advised to call back or seek an in-person evaluation if the symptoms worsen or if the condition fails to improve as anticipated.  Time:  I spent 15 minutes with the patient via telehealth technology discussing the above problems/concerns.    [name], PA-C

## 2020-11-17 ENCOUNTER — Telehealth: Payer: Self-pay | Admitting: *Deleted

## 2020-11-17 NOTE — Telephone Encounter (Signed)
Patient sent a My Chart response due to completed covid questionnaire responses on 11/17/20 at 11:07 am. Care advise given for management and treatment of cough, and appetite and to contact PCP if worsens.

## 2021-02-04 ENCOUNTER — Telehealth: Payer: BC Managed Care – PPO | Admitting: Physician Assistant

## 2021-02-04 DIAGNOSIS — R300 Dysuria: Secondary | ICD-10-CM

## 2021-02-04 MED ORDER — NITROFURANTOIN MONOHYD MACRO 100 MG PO CAPS: 100.0000 mg | ORAL_CAPSULE | Freq: Two times a day (BID) | ORAL | 0 refills | Status: DC

## 2021-02-04 MED ORDER — FLUCONAZOLE 150 MG PO TABS: 150.0000 mg | ORAL_TABLET | Freq: Once | ORAL | 0 refills | Status: AC

## 2021-02-04 NOTE — Progress Notes (Signed)
Virtual Visit Consent   Tracy Harris, you are scheduled for a virtual visit with a [facility] provider today.     Just as with appointments in the office, your consent must be obtained to participate.  Your consent will be active for this visit and any virtual visit you may have with one of our providers in the next 365 days.     If you have a MyChart account, a copy of this consent can be sent to you electronically.  All virtual visits are billed to your insurance company just like a traditional visit in the office.    As this is a virtual visit, video technology does not allow for your provider to perform a traditional examination.  This may limit your provider's ability to fully assess your condition.  If your provider identifies any concerns that need to be evaluated in person or the need to arrange testing (such as labs, EKG, etc.), we will make arrangements to do so.     Although advances in technology are sophisticated, we cannot ensure that it will always work on either your end or our end.  If the connection with a video visit is poor, the visit may have to be switched to a telephone visit.  With either a video or telephone visit, we are not always able to ensure that we have a secure connection.     I need to obtain your verbal consent now.   Are you willing to proceed with your visit today?    Tracy Harris has provided verbal consent on 02/04/2021 for a virtual visit (video or telephone).   [name], [place]   Date: 02/04/2021 7:55 AM   Virtual Visit via Video Note   I, [name], connected with  Tracy Harris  ([id number], 1981-02-23) on 02/04/21 at  8:40 AM EST by a video-enabled telemedicine application and verified that I am speaking with the correct person using two identifiers.  Location: Patient: Virtual Visit Location Patient: Home Provider: Virtual Visit Location Provider: Home Office   I discussed the limitations of evaluation  and management by telemedicine and the availability of in person appointments. The patient expressed understanding and agreed to proceed.    History of Present Illness: Tracy Harris is a 40 y.o. who identifies as a female who was assigned female at birth, and is being seen today for UTI symptoms x 3 days. Endorses dysuria, urinary urgency or frequency. Denies fever, chills, flank pain. Denies hematuria. Has been taking AZO to help with burning. Denies any vaginal or pelvic symptoms. No concern for pregnancy.     HPI: HPI  Problems:  Patient Active Problem List   Diagnosis Date Noted   Allergic rhinitis due to pollen 04/13/2020   Anxiety 04/13/2020   Nephrolithiasis 04/13/2020   Sciatica 04/13/2020   Gastroesophageal reflux disease without esophagitis 06/01/2019   Herpes genitalis in women 06/01/2019   Intermittent asthma, uncomplicated 06/01/2019   Displacement of lumbar intervertebral disc without myelopathy 06/01/2017   Premature rupture of membranes 05/02/2016   Dichorionic diamniotic twin pregnancy in third trimester 05/02/2016   Previous cesarean delivery affecting pregnancy 05/02/2016   Postpartum care s/p repeat c-section: twins (3/10) 05/02/2016   Asthma, severe persistent 03/06/2013   Moderate persistent asthma 06/13/2012   Bipolar 1 disorder (HCC) 08/24/2010    Allergies:  Allergies  Allergen Reactions   Ciprofloxacin Nausea Only   Other Swelling and Other (See Comments)    Pt is allergic to  seprafilm medication used in abdomen during last laparoscopy surgery. Reaction:  Extreme abdominal swelling and pain   Medications:  Current Outpatient Medications:    fluconazole (DIFLUCAN) 150 MG tablet, Take 1 tablet (150 mg total) by mouth once for 1 dose., Disp: 1 tablet, Rfl: 0   nitrofurantoin, macrocrystal-monohydrate, (MACROBID) 100 MG capsule, Take 1 capsule (100 mg total) by mouth 2 (two) times daily., Disp: 10 capsule, Rfl: 0   ALPRAZolam (XANAX) 0.5 MG tablet,  TAKE ONE TABLET BY MOUTH THREE TIMES A DAY AS NEEDED FOR SLEEP, Disp: , Rfl:    buPROPion (WELLBUTRIN XL) 150 MG 24 hr tablet, bupropion HCl XL 150 mg 24 hr tablet, extended release, Disp: , Rfl:    buPROPion (WELLBUTRIN XL) 300 MG 24 hr tablet, Take 300 mg by mouth daily., Disp: , Rfl:    celecoxib (CELEBREX) 200 MG capsule, Take by mouth 2 (two) times daily., Disp: , Rfl:    etonogestrel-ethinyl estradiol (NUVARING) 0.12-0.015 MG/24HR vaginal ring, Place vaginally., Disp: , Rfl:    fluticasone-salmeterol (ADVAIR) 250-50 MCG/ACT AEPB, Inhale 1 puff into the lungs in the morning and at bedtime., Disp: 60 each, Rfl: 0   folic acid (FOLVITE) 1 MG tablet, Take 1 mg by mouth at bedtime., Disp: , Rfl:    labetalol (NORMODYNE) 100 MG tablet, Take 1 tablet (100 mg total) by mouth 2 (two) times daily., Disp: 60 tablet, Rfl: 1   lamoTRIgine (LAMICTAL) 100 MG tablet, Take 100 mg by mouth daily., Disp: , Rfl:    ondansetron (ZOFRAN ODT) 8 MG disintegrating tablet, Take 1 tablet (8 mg total) by mouth every 8 (eight) hours as needed for nausea or vomiting., Disp: 20 tablet, Rfl: 0   pantoprazole (PROTONIX) 40 MG tablet, Take 40 mg by mouth daily., Disp: , Rfl:    spironolactone (ALDACTONE) 50 MG tablet, Take 50 mg by mouth 2 (two) times daily., Disp: , Rfl:    SUMAtriptan (IMITREX) 50 MG tablet, Take 1 tablet (50 mg total) by mouth once for 1 dose. May repeat in 2 hours if headache persists or recurs., Disp: 10 tablet, Rfl: 0  Observations/Objective: Patient is well-developed, well-nourished in no acute distress.  Resting comfortably at home.  Head is normocephalic, atraumatic.  No labored breathing. Speech is clear and coherent with logical content.  Patient is alert and oriented at baseline.   Assessment and Plan: 1. Dysuria - nitrofurantoin, macrocrystal-monohydrate, (MACROBID) 100 MG capsule; Take 1 capsule (100 mg total) by mouth 2 (two) times daily.  Dispense: 10 capsule; Refill: 0 - fluconazole  (DIFLUCAN) 150 MG tablet; Take 1 tablet (150 mg total) by mouth once for 1 dose.  Dispense: 1 tablet; Refill: 0 Symptoms consistent with uncomplicated cystitis. Start Macrobid empirically for treatment. Supportive measures and OTC medications reviewed. Will place Diflucan on file at pharmacy as she has history of antibiotic-associated yeast infections. Strict in-person evaluation precautions reviewed with patient.   Follow Up Instructions: I discussed the assessment and treatment plan with the patient. The patient was provided an opportunity to ask questions and all were answered. The patient agreed with the plan and demonstrated an understanding of the instructions.  A copy of instructions were sent to the patient via MyChart unless otherwise noted below.   The patient was advised to call back or seek an in-person evaluation if the symptoms worsen or if the condition fails to improve as anticipated.  Time:  I spent 10 minutes with the patient via telehealth technology discussing the above problems/concerns.  Leeanne Rio, PA-C

## 2021-02-04 NOTE — Patient Instructions (Signed)
[name], thank you for joining [name], PA-C for today's virtual visit.  While this provider is not your primary care provider (PCP), if your PCP is located in our provider database this encounter information will be shared with them immediately following your visit.  Consent: (Patient) Tracy Harris provided verbal consent for this virtual visit at the beginning of the encounter.  Current Medications:  Current Outpatient Medications:    albuterol (VENTOLIN HFA) 108 (90 Base) MCG/ACT inhaler, Inhale 2 puffs into the lungs every 6 (six) hours as needed for wheezing or shortness of breath., Disp: 8 g, Rfl: 0   ALPRAZolam (XANAX) 0.5 MG tablet, TAKE ONE TABLET BY MOUTH THREE TIMES A DAY AS NEEDED FOR SLEEP, Disp: , Rfl:    benzonatate (TESSALON) 100 MG capsule, Take 1 capsule (100 mg total) by mouth 3 (three) times daily as needed for cough., Disp: 30 capsule, Rfl: 0   buPROPion (WELLBUTRIN XL) 150 MG 24 hr tablet, bupropion HCl XL 150 mg 24 hr tablet, extended release, Disp: , Rfl:    buPROPion (WELLBUTRIN XL) 300 MG 24 hr tablet, Take 300 mg by mouth daily., Disp: , Rfl:    celecoxib (CELEBREX) 200 MG capsule, Take by mouth 2 (two) times daily., Disp: , Rfl:    etonogestrel-ethinyl estradiol (NUVARING) 0.12-0.015 MG/24HR vaginal ring, Place vaginally., Disp: , Rfl:    fluticasone-salmeterol (ADVAIR) 250-50 MCG/ACT AEPB, Inhale 1 puff into the lungs in the morning and at bedtime., Disp: 60 each, Rfl: 0   folic acid (FOLVITE) 1 MG tablet, Take 1 mg by mouth at bedtime., Disp: , Rfl:    labetalol (NORMODYNE) 100 MG tablet, Take 1 tablet (100 mg total) by mouth 2 (two) times daily., Disp: 60 tablet, Rfl: 1   lamoTRIgine (LAMICTAL) 100 MG tablet, Take 100 mg by mouth daily., Disp: , Rfl:    ondansetron (ZOFRAN ODT) 8 MG disintegrating tablet, Take 1 tablet (8 mg total) by mouth every 8 (eight) hours as needed for nausea or vomiting., Disp: 20 tablet, Rfl: 0    pantoprazole (PROTONIX) 40 MG tablet, Take 40 mg by mouth daily., Disp: , Rfl:    spironolactone (ALDACTONE) 50 MG tablet, Take 50 mg by mouth 2 (two) times daily., Disp: , Rfl:    SUMAtriptan (IMITREX) 50 MG tablet, Take 1 tablet (50 mg total) by mouth once for 1 dose. May repeat in 2 hours if headache persists or recurs., Disp: 10 tablet, Rfl: 0   Medications ordered in this encounter:  No orders of the defined types were placed in this encounter.    *If you need refills on other medications prior to your next appointment, please contact your pharmacy*  Follow-Up: Call back or seek an in-person evaluation if the symptoms worsen or if the condition fails to improve as anticipated.  Other Instructions Your symptoms are consistent with a bladder infection, also called acute cystitis. Please take your antibiotic (Macrobid) as directed until all pills are gone.  Stay very well hydrated.  Consider a daily probiotic (Align, Culturelle, or Activia) to help prevent stomach upset caused by the antibiotic.  Taking a probiotic daily may also help prevent recurrent UTIs.  Also consider taking AZO (Phenazopyridine) tablets to help decrease pain with urination.    If symptoms are not resolving, or you note any new or worsening symptoms, please be evaluated in person ASAP.   Urinary Tract Infection A urinary tract infection (UTI) can occur any place along the urinary tract. The tract  includes the kidneys, ureters, bladder, and urethra. A type of germ called bacteria often causes a UTI. UTIs are often helped with antibiotic medicine.  HOME CARE  If given, take antibiotics as told by your doctor. Finish them even if you start to feel better. Drink enough fluids to keep your pee (urine) clear or pale yellow. Avoid tea, drinks with caffeine, and bubbly (carbonated) drinks. Pee often. Avoid holding your pee in for a long time. Pee before and after having sex (intercourse). Wipe from front to back after you  poop (bowel movement) if you are a woman. Use each tissue only once. GET HELP RIGHT AWAY IF:  You have back pain. You have lower belly (abdominal) pain. You have chills. You feel sick to your stomach (nauseous). You throw up (vomit). Your burning or discomfort with peeing does not go away. You have a fever. Your symptoms are not better in 3 days. MAKE SURE YOU:  Understand these instructions. Will watch your condition. Will get help right away if you are not doing well or get worse. Document Released: 07/29/2007 Document Revised: 11/04/2011 Document Reviewed: 09/10/2011 [facility] Patient Information 2015 [place], [place]. This information is not intended to replace advice given to you by your health care provider. Make sure you discuss any questions you have with your health care provider.    If you have been instructed to have an in-person evaluation today at a local Urgent Care facility, please use the link below. It will take you to a list of all of our available [facility], including address, phone number and hours of operation. Please do not delay care.  [facility]  If you or a family member do not have a primary care provider, use the link below to schedule a visit and establish care. When you choose a [facility] primary care physician or advanced practice provider, you gain a long-term partner in health. Find a Primary Care Provider  Learn more about [facility] in-office and virtual care options: [facility] - Get Care Now

## 2021-02-26 ENCOUNTER — Telehealth: Payer: BC Managed Care – PPO | Admitting: Physician Assistant

## 2021-02-26 DIAGNOSIS — A084 Viral intestinal infection, unspecified: Secondary | ICD-10-CM | POA: Diagnosis not present

## 2021-02-26 MED ORDER — PROMETHAZINE HCL 25 MG RE SUPP: 25.0000 mg | Freq: Four times a day (QID) | RECTAL | 0 refills | Status: AC | PRN

## 2021-02-26 NOTE — Patient Instructions (Signed)
[name], thank you for joining [name], PA-C for today's virtual visit.  While this provider is not your primary care provider (PCP), if your PCP is located in our provider database this encounter information will be shared with them immediately following your visit.  Consent: (Patient) Tracy Harris provided verbal consent for this virtual visit at the beginning of the encounter.  Current Medications:  Current Outpatient Medications:    ALPRAZolam (XANAX) 0.5 MG tablet, TAKE ONE TABLET BY MOUTH THREE TIMES A DAY AS NEEDED FOR SLEEP, Disp: , Rfl:    buPROPion (WELLBUTRIN XL) 150 MG 24 hr tablet, bupropion HCl XL 150 mg 24 hr tablet, extended release, Disp: , Rfl:    buPROPion (WELLBUTRIN XL) 300 MG 24 hr tablet, Take 300 mg by mouth daily., Disp: , Rfl:    celecoxib (CELEBREX) 200 MG capsule, Take by mouth 2 (two) times daily., Disp: , Rfl:    etonogestrel-ethinyl estradiol (NUVARING) 0.12-0.015 MG/24HR vaginal ring, Place vaginally., Disp: , Rfl:    fluticasone-salmeterol (ADVAIR) 250-50 MCG/ACT AEPB, Inhale 1 puff into the lungs in the morning and at bedtime., Disp: 60 each, Rfl: 0   folic acid (FOLVITE) 1 MG tablet, Take 1 mg by mouth at bedtime., Disp: , Rfl:    labetalol (NORMODYNE) 100 MG tablet, Take 1 tablet (100 mg total) by mouth 2 (two) times daily., Disp: 60 tablet, Rfl: 1   lamoTRIgine (LAMICTAL) 100 MG tablet, Take 100 mg by mouth daily., Disp: , Rfl:    nitrofurantoin, macrocrystal-monohydrate, (MACROBID) 100 MG capsule, Take 1 capsule (100 mg total) by mouth 2 (two) times daily., Disp: 10 capsule, Rfl: 0   ondansetron (ZOFRAN ODT) 8 MG disintegrating tablet, Take 1 tablet (8 mg total) by mouth every 8 (eight) hours as needed for nausea or vomiting., Disp: 20 tablet, Rfl: 0   pantoprazole (PROTONIX) 40 MG tablet, Take 40 mg by mouth daily., Disp: , Rfl:    spironolactone (ALDACTONE) 50 MG tablet, Take 50 mg by mouth 2 (two) times daily., Disp: , Rfl:     SUMAtriptan (IMITREX) 50 MG tablet, Take 1 tablet (50 mg total) by mouth once for 1 dose. May repeat in 2 hours if headache persists or recurs., Disp: 10 tablet, Rfl: 0   Medications ordered in this encounter:  No orders of the defined types were placed in this encounter.    *If you need refills on other medications prior to your next appointment, please contact your pharmacy*  Follow-Up: Call back or seek an in-person evaluation if the symptoms worsen or if the condition fails to improve as anticipated.  Other Instructions Please keep well-hydrated and get plenty of rest. Use the promethazine suppository as directed to help calm nausea/vomiting.  AS you feel up to it, follow the diet below when reintroducing solid foods.  If you cannot stop vomiting or if you develop severe diarrhea on top of this, where you cannot keep hydrated, you need to be evaluated in person at nearest Urgent Care or ER facility. Do not delay care.   Bland Diet A bland diet consists of foods that are often soft and do not have a lot of fat, fiber, or extra seasonings. Foods without fat, fiber, or seasoning are easier for the body to digest. They are also less likely to irritate your mouth, throat, stomach, and other parts of your digestive system. A bland diet is sometimes called a BRAT diet. What is my plan? Your health care provider or food and  nutrition specialist (dietitian) may recommend specific changes to your diet to prevent symptoms or to treat your symptoms. These changes may include: Eating small meals often. Cooking food until it is soft enough to chew easily. Chewing your food well. Drinking fluids slowly. Not eating foods that are very spicy, sour, or fatty. Not eating citrus fruits, such as oranges and grapefruit. What do I need to know about this diet? Eat a variety of foods from the bland diet food list. Do not follow a bland diet longer than needed. Ask your health care provider whether you  should take vitamins or supplements. What foods can I eat? Grains Hot cereals, such as cream of wheat. Rice. Bread, crackers, or tortillas made from refined white flour. Vegetables Canned or cooked vegetables. Mashed or boiled potatoes. Fruits Bananas. Applesauce. Other types of cooked or canned fruit with the skin and seeds removed, such as canned peaches or pears. Meats and other proteins Scrambled eggs. Creamy peanut butter or other nut butters. Lean, well-cooked meats, such as chicken or fish. Tofu. Soups or broths. Dairy Low-fat dairy products, such as milk, cottage cheese, or yogurt. Beverages Water. Herbal tea. Apple juice. Fats and oils Mild salad dressings. Canola or olive oil. Sweets and desserts Pudding. Custard. Fruit gelatin. Ice cream. The items listed above may not be a complete list of recommended foods and beverages. Contact a dietitian for more options. What foods are not recommended? Grains Whole grain breads and cereals. Vegetables Raw vegetables. Fruits Raw fruits, especially citrus, berries, or dried fruits. Dairy Whole fat dairy foods. Beverages Caffeinated drinks. Alcohol. Seasonings and condiments Strongly flavored seasonings or condiments. Hot sauce. Salsa. Other foods Spicy foods. Fried foods. Sour foods, such as pickled or fermented foods. Foods with high sugar content. Foods high in fiber. The items listed above may not be a complete list of foods and beverages to avoid. Contact a dietitian for more information. Summary A bland diet consists of foods that are often soft and do not have a lot of fat, fiber, or extra seasonings. Foods without fat, fiber, or seasoning are easier for the body to digest. Check with your health care provider to see how long you should follow this diet plan. It is not meant to be followed for long periods. This information is not intended to replace advice given to you by your health care provider. Make sure you discuss  any questions you have with your health care provider. Document Revised: 03/10/2017 Document Reviewed: 03/10/2017 Elsevier Patient Education  2022 [employer].    If you have been instructed to have an in-person evaluation today at a local Urgent Care facility, please use the link below. It will take you to a list of all of our available [facility], including address, phone number and hours of operation. Please do not delay care.  [facility]  If you or a family member do not have a primary care provider, use the link below to schedule a visit and establish care. When you choose a [facility] primary care physician or advanced practice provider, you gain a long-term partner in health. Find a Primary Care Provider  Learn more about [facility]'s in-office and virtual care options: [facility] - Get Care Now

## 2021-02-26 NOTE — Progress Notes (Signed)
Virtual Visit Consent   Tracy Harris, you are scheduled for a virtual visit with a [facility] provider today.     Just as with appointments in the office, your consent must be obtained to participate.  Your consent will be active for this visit and any virtual visit you may have with one of our providers in the next 365 days.     If you have a MyChart account, a copy of this consent can be sent to you electronically.  All virtual visits are billed to your insurance company just like a traditional visit in the office.    As this is a virtual visit, video technology does not allow for your provider to perform a traditional examination.  This may limit your provider's ability to fully assess your condition.  If your provider identifies any concerns that need to be evaluated in person or the need to arrange testing (such as labs, EKG, etc.), we will make arrangements to do so.     Although advances in technology are sophisticated, we cannot ensure that it will always work on either your end or our end.  If the connection with a video visit is poor, the visit may have to be switched to a telephone visit.  With either a video or telephone visit, we are not always able to ensure that we have a secure connection.     I need to obtain your verbal consent now.   Are you willing to proceed with your visit today?    Tracy Harris has provided verbal consent on 02/26/2021 for a virtual visit (video or telephone).   [name], [place]   Date: 02/26/2021 7:45 AM   Virtual Visit via Video Note   I, [name], connected with  Tracy Harris  ([id number], February 21, 1981) on 02/26/21 at  8:15 AM EST by a video-enabled telemedicine application and verified that I am speaking with the correct person using two identifiers.  Location: Patient: Virtual Visit Location Patient: Home Provider: Virtual Visit Location Provider: Home Office   I discussed the limitations of evaluation and  management by telemedicine and the availability of in person appointments. The patient expressed understanding and agreed to proceed.    History of Present Illness: Tracy Harris is a 41 y.o. who identifies as a female who was assigned female at birth, and is being seen today for possible gastroenteritis. Notes around 3:30 this morning starting with nausea and several episodes of non-bloody emesis. Denies diarrhea but notes some mild abdominal cramping. Denies fever, chills, aches. Took an old Zofran ODT that did not help. Denies recent travel or known sick contact. Denies new or abnormal food/water source.   HPI: HPI  Problems:  Patient Active Problem List   Diagnosis Date Noted   Allergic rhinitis due to pollen 04/13/2020   Anxiety 04/13/2020   Nephrolithiasis 04/13/2020   Sciatica 04/13/2020   Gastroesophageal reflux disease without esophagitis 06/01/2019   Herpes genitalis in women 06/01/2019   Intermittent asthma, uncomplicated 06/01/2019   Displacement of lumbar intervertebral disc without myelopathy 06/01/2017   Premature rupture of membranes 05/02/2016   Dichorionic diamniotic twin pregnancy in third trimester 05/02/2016   Previous cesarean delivery affecting pregnancy 05/02/2016   Postpartum care s/p repeat c-section: twins (3/10) 05/02/2016   Asthma, severe persistent 03/06/2013   Moderate persistent asthma 06/13/2012   Bipolar 1 disorder (HCC) 08/24/2010    Allergies:  Allergies  Allergen Reactions   Ciprofloxacin Nausea Only   Other  Swelling and Other (See Comments)    Pt is allergic to seprafilm medication used in abdomen during last laparoscopy surgery. Reaction:  Extreme abdominal swelling and pain   Medications:  Current Outpatient Medications:    promethazine (PHENERGAN) 25 MG suppository, Place 1 suppository (25 mg total) rectally every 6 (six) hours as needed for nausea or vomiting., Disp: 12 each, Rfl: 0   ALPRAZolam (XANAX) 0.5 MG tablet, TAKE ONE TABLET  BY MOUTH THREE TIMES A DAY AS NEEDED FOR SLEEP, Disp: , Rfl:    buPROPion (WELLBUTRIN XL) 150 MG 24 hr tablet, bupropion HCl XL 150 mg 24 hr tablet, extended release, Disp: , Rfl:    buPROPion (WELLBUTRIN XL) 300 MG 24 hr tablet, Take 300 mg by mouth daily., Disp: , Rfl:    celecoxib (CELEBREX) 200 MG capsule, Take by mouth 2 (two) times daily., Disp: , Rfl:    etonogestrel-ethinyl estradiol (NUVARING) 0.12-0.015 MG/24HR vaginal ring, Place vaginally., Disp: , Rfl:    fluticasone-salmeterol (ADVAIR) 250-50 MCG/ACT AEPB, Inhale 1 puff into the lungs in the morning and at bedtime., Disp: 60 each, Rfl: 0   folic acid (FOLVITE) 1 MG tablet, Take 1 mg by mouth at bedtime., Disp: , Rfl:    labetalol (NORMODYNE) 100 MG tablet, Take 1 tablet (100 mg total) by mouth 2 (two) times daily., Disp: 60 tablet, Rfl: 1   lamoTRIgine (LAMICTAL) 100 MG tablet, Take 100 mg by mouth daily., Disp: , Rfl:    pantoprazole (PROTONIX) 40 MG tablet, Take 40 mg by mouth daily., Disp: , Rfl:    spironolactone (ALDACTONE) 50 MG tablet, Take 50 mg by mouth 2 (two) times daily., Disp: , Rfl:    SUMAtriptan (IMITREX) 50 MG tablet, Take 1 tablet (50 mg total) by mouth once for 1 dose. May repeat in 2 hours if headache persists or recurs., Disp: 10 tablet, Rfl: 0  Observations/Objective: Patient is well-developed, well-nourished in no acute distress.  Resting comfortably on bed at home.  Head is normocephalic, atraumatic.  No labored breathing. Speech is clear and coherent with logical content.  Patient is alert and oriented at baseline.   Assessment and Plan: 1. Viral gastroenteritis  Supportive measures and OTC medications reviewed. Discussed keeping hydrated is the most important thing at present. Will give Rx promethazine -- she request suppository as she has had to take before. Think this is reasonable giving issue with emesis so we can make sure medication stays in her system to allow her to hydrate. BRAT diet reviewed.  Strict UC/ER precautions reviewed.   Follow Up Instructions: I discussed the assessment and treatment plan with the patient. The patient was provided an opportunity to ask questions and all were answered. The patient agreed with the plan and demonstrated an understanding of the instructions.  A copy of instructions were sent to the patient via MyChart unless otherwise noted below.   The patient was advised to call back or seek an in-person evaluation if the symptoms worsen or if the condition fails to improve as anticipated.  Time:  I spent 10 minutes with the patient via telehealth technology discussing the above problems/concerns.    [name], PA-C

## 2021-03-14 ENCOUNTER — Telehealth: Payer: BC Managed Care – PPO | Admitting: Emergency Medicine

## 2021-03-14 DIAGNOSIS — N390 Urinary tract infection, site not specified: Secondary | ICD-10-CM

## 2021-03-14 MED ORDER — FLUCONAZOLE 150 MG PO TABS: 150.0000 mg | ORAL_TABLET | Freq: Once | ORAL | 0 refills | Status: AC

## 2021-03-14 MED ORDER — ONDANSETRON 4 MG PO TBDP: 4.0000 mg | ORAL_TABLET | Freq: Three times a day (TID) | ORAL | 0 refills | Status: AC | PRN

## 2021-03-14 MED ORDER — CEPHALEXIN 500 MG PO CAPS: 500.0000 mg | ORAL_CAPSULE | Freq: Two times a day (BID) | ORAL | 0 refills | Status: AC

## 2021-03-14 NOTE — Progress Notes (Signed)
Virtual Visit Consent   Tracy Harris, you are scheduled for a virtual visit with a [facility] provider today.     Just as with appointments in the office, your consent must be obtained to participate.  Your consent will be active for this visit and any virtual visit you may have with one of our providers in the next 365 days.     If you have a MyChart account, a copy of this consent can be sent to you electronically.  All virtual visits are billed to your insurance company just like a traditional visit in the office.    As this is a virtual visit, video technology does not allow for your provider to perform a traditional examination.  This may limit your provider's ability to fully assess your condition.  If your provider identifies any concerns that need to be evaluated in person or the need to arrange testing (such as labs, EKG, etc.), we will make arrangements to do so.     Although advances in technology are sophisticated, we cannot ensure that it will always work on either your end or our end.  If the connection with a video visit is poor, the visit may have to be switched to a telephone visit.  With either a video or telephone visit, we are not always able to ensure that we have a secure connection.     I need to obtain your verbal consent now.   Are you willing to proceed with your visit today?    Tracy Harris has provided verbal consent on 03/14/2021 for a virtual visit video.   Roxy Horseman, PA-C   Date: 03/14/2021 4:58 PM   Virtual Visit via Video Note   I, Roxy Horseman, connected with  Tracy Harris  ([id number], August 10, 1980) on 03/14/21 at  5:00 PM EST by a video-enabled telemedicine application and verified that I am speaking with the correct person using two identifiers.  Location: Patient: Virtual Visit Location Patient: Home Provider: Virtual Visit Location Provider: Home Office   I discussed the limitations of evaluation and management by  telemedicine and the availability of in person appointments. The patient expressed understanding and agreed to proceed.    History of Present Illness: Tracy Harris is a 41 y.o. who identifies as a female who was assigned female at birth, and is being seen today for dysuria and frequency.  She also reports associated nausea.  Has tried AZO.  Has had 2 days of symptoms.  Denies vomiting.  Denies any fever.  Denies any flank pain.  HPI: HPI  Problems:  Patient Active Problem List   Diagnosis Date Noted   Allergic rhinitis due to pollen 04/13/2020   Anxiety 04/13/2020   Nephrolithiasis 04/13/2020   Sciatica 04/13/2020   Gastroesophageal reflux disease without esophagitis 06/01/2019   Herpes genitalis in women 06/01/2019   Intermittent asthma, uncomplicated 06/01/2019   Displacement of lumbar intervertebral disc without myelopathy 06/01/2017   Premature rupture of membranes 05/02/2016   Dichorionic diamniotic twin pregnancy in third trimester 05/02/2016   Previous cesarean delivery affecting pregnancy 05/02/2016   Postpartum care s/p repeat c-section: twins (3/10) 05/02/2016   Asthma, severe persistent 03/06/2013   Moderate persistent asthma 06/13/2012   Bipolar 1 disorder (HCC) 08/24/2010    Allergies:  Allergies  Allergen Reactions   Ciprofloxacin Nausea Only   Other Swelling and Other (See Comments)    Pt is allergic to seprafilm medication used in abdomen during last laparoscopy surgery. Reaction:  Extreme abdominal swelling and pain   Medications:  Current Outpatient Medications:    ALPRAZolam (XANAX) 0.5 MG tablet, TAKE ONE TABLET BY MOUTH THREE TIMES A DAY AS NEEDED FOR SLEEP, Disp: , Rfl:    buPROPion (WELLBUTRIN XL) 150 MG 24 hr tablet, bupropion HCl XL 150 mg 24 hr tablet, extended release, Disp: , Rfl:    buPROPion (WELLBUTRIN XL) 300 MG 24 hr tablet, Take 300 mg by mouth daily., Disp: , Rfl:    celecoxib (CELEBREX) 200 MG capsule, Take by mouth 2 (two) times  daily., Disp: , Rfl:    etonogestrel-ethinyl estradiol (NUVARING) 0.12-0.015 MG/24HR vaginal ring, Place vaginally., Disp: , Rfl:    fluticasone-salmeterol (ADVAIR) 250-50 MCG/ACT AEPB, Inhale 1 puff into the lungs in the morning and at bedtime., Disp: 60 each, Rfl: 0   folic acid (FOLVITE) 1 MG tablet, Take 1 mg by mouth at bedtime., Disp: , Rfl:    labetalol (NORMODYNE) 100 MG tablet, Take 1 tablet (100 mg total) by mouth 2 (two) times daily., Disp: 60 tablet, Rfl: 1   lamoTRIgine (LAMICTAL) 100 MG tablet, Take 100 mg by mouth daily., Disp: , Rfl:    pantoprazole (PROTONIX) 40 MG tablet, Take 40 mg by mouth daily., Disp: , Rfl:    promethazine (PHENERGAN) 25 MG suppository, Place 1 suppository (25 mg total) rectally every 6 (six) hours as needed for nausea or vomiting., Disp: 12 each, Rfl: 0   spironolactone (ALDACTONE) 50 MG tablet, Take 50 mg by mouth 2 (two) times daily., Disp: , Rfl:    SUMAtriptan (IMITREX) 50 MG tablet, Take 1 tablet (50 mg total) by mouth once for 1 dose. May repeat in 2 hours if headache persists or recurs., Disp: 10 tablet, Rfl: 0  Observations/Objective: Patient is well-developed, well-nourished in no acute distress.  Resting comfortably  at home.  Head is normocephalic, atraumatic.  No labored breathing.  Speech is clear and coherent with logical content.  Patient is alert and oriented at baseline.    Assessment and Plan: There are no diagnoses linked to this encounter. -Keflex for UTI -Diflucan for yeast prophylaxis -Zofran for nausea -Urology followup  Follow Up Instructions: I discussed the assessment and treatment plan with the patient. The patient was provided an opportunity to ask questions and all were answered. The patient agreed with the plan and demonstrated an understanding of the instructions.  A copy of instructions were sent to the patient via MyChart unless otherwise noted below.     The patient was advised to call back or seek an in-person  evaluation if the symptoms worsen or if the condition fails to improve as anticipated.  Time:  I spent 12 minutes with the patient via telehealth technology discussing the above problems/concerns.    Roxy Horseman, PA-C

## 2021-08-23 ENCOUNTER — Telehealth: Payer: BC Managed Care – PPO | Admitting: Nurse Practitioner

## 2021-08-23 DIAGNOSIS — J0140 Acute pansinusitis, unspecified: Secondary | ICD-10-CM | POA: Diagnosis not present

## 2021-08-23 DIAGNOSIS — N3000 Acute cystitis without hematuria: Secondary | ICD-10-CM

## 2021-08-23 DIAGNOSIS — H1033 Unspecified acute conjunctivitis, bilateral: Secondary | ICD-10-CM

## 2021-08-23 DIAGNOSIS — B379 Candidiasis, unspecified: Secondary | ICD-10-CM | POA: Diagnosis not present

## 2021-08-23 MED ORDER — SULFAMETHOXAZOLE-TRIMETHOPRIM 800-160 MG PO TABS: 1.0000 | ORAL_TABLET | Freq: Two times a day (BID) | ORAL | 0 refills | Status: AC

## 2021-08-23 MED ORDER — FLUCONAZOLE 150 MG PO TABS: 150.0000 mg | ORAL_TABLET | Freq: Once | ORAL | 0 refills | Status: AC

## 2021-08-23 MED ORDER — POLYMYXIN B-TRIMETHOPRIM 10000-0.1 UNIT/ML-% OP SOLN: 1.0000 [drp] | OPHTHALMIC | 0 refills | Status: AC

## 2021-08-23 NOTE — Progress Notes (Signed)
Virtual Visit Consent   Tracy Harris, you are scheduled for a virtual visit with a [facility] provider today. Just as with appointments in the office, your consent must be obtained to participate. Your consent will be active for this visit and any virtual visit you may have with one of our providers in the next 365 days. If you have a MyChart account, a copy of this consent can be sent to you electronically.  As this is a virtual visit, video technology does not allow for your provider to perform a traditional examination. This may limit your provider's ability to fully assess your condition. If your provider identifies any concerns that need to be evaluated in person or the need to arrange testing (such as labs, EKG, etc.), we will make arrangements to do so. Although advances in technology are sophisticated, we cannot ensure that it will always work on either your end or our end. If the connection with a video visit is poor, the visit may have to be switched to a telephone visit. With either a video or telephone visit, we are not always able to ensure that we have a secure connection.  By engaging in this virtual visit, you consent to the provision of healthcare and authorize for your insurance to be billed (if applicable) for the services provided during this visit. Depending on your insurance coverage, you may receive a charge related to this service.  I need to obtain your verbal consent now. Are you willing to proceed with your visit today? Tracy Harris has provided verbal consent on 08/23/2021 for a virtual visit (video or telephone). Tracy Simas, FNP  Date: 08/23/2021 9:24 AM  Virtual Visit via Video Note   I, Tracy Harris, connected with  Tracy Harris  ([id number], 23-Mar-1980) on 08/23/21 at  9:30 AM EDT by a video-enabled telemedicine application and verified that I am speaking with the correct person using two identifiers.  Location: Patient: Virtual Visit Location  Patient: Home Provider: Virtual Visit Location Provider: Home Office   I discussed the limitations of evaluation and management by telemedicine and the availability of in person appointments. The patient expressed understanding and agreed to proceed.    History of Present Illness: Tracy Harris is a 41 y.o. who identifies as a female who was assigned female at birth, and is being seen today for sinus congestion and sinus headache and drainage through here eyes that were crusted shut this morning.   She also has burning when she urinates (she does have Macrobid preventative) that she started to use without relief.  She was up all night urinating.  She has vaginal itching and discharge as well    Problems:  Patient Active Problem List   Diagnosis Date Noted   Allergic rhinitis due to pollen 04/13/2020   Anxiety 04/13/2020   Nephrolithiasis 04/13/2020   Sciatica 04/13/2020   Gastroesophageal reflux disease without esophagitis 06/01/2019   Herpes genitalis in women 06/01/2019   Intermittent asthma, uncomplicated 06/01/2019   Displacement of lumbar intervertebral disc without myelopathy 06/01/2017   Premature rupture of membranes 05/02/2016   Dichorionic diamniotic twin pregnancy in third trimester 05/02/2016   Previous cesarean delivery affecting pregnancy 05/02/2016   Postpartum care s/p repeat c-section: twins (3/10) 05/02/2016   Asthma, severe persistent 03/06/2013   Moderate persistent asthma 06/13/2012   Bipolar 1 disorder (HCC) 08/24/2010    Allergies:  Allergies  Allergen Reactions   Ciprofloxacin Nausea Only   Other Swelling and Other (See Comments)  Pt is allergic to seprafilm medication used in abdomen during last laparoscopy surgery. Reaction:  Extreme abdominal swelling and pain   Medications:  Current Outpatient Medications:    ALPRAZolam (XANAX) 0.5 MG tablet, TAKE ONE TABLET BY MOUTH THREE TIMES A DAY AS NEEDED FOR SLEEP, Disp: , Rfl:    buPROPion  (WELLBUTRIN XL) 150 MG 24 hr tablet, bupropion HCl XL 150 mg 24 hr tablet, extended release, Disp: , Rfl:    buPROPion (WELLBUTRIN XL) 300 MG 24 hr tablet, Take 300 mg by mouth daily., Disp: , Rfl:    celecoxib (CELEBREX) 200 MG capsule, Take by mouth 2 (two) times daily., Disp: , Rfl:    cephALEXin (KEFLEX) 500 MG capsule, Take 1 capsule (500 mg total) by mouth 2 (two) times daily., Disp: 14 capsule, Rfl: 0   etonogestrel-ethinyl estradiol (NUVARING) 0.12-0.015 MG/24HR vaginal ring, Place vaginally., Disp: , Rfl:    fluticasone-salmeterol (ADVAIR) 250-50 MCG/ACT AEPB, Inhale 1 puff into the lungs in the morning and at bedtime., Disp: 60 each, Rfl: 0   folic acid (FOLVITE) 1 MG tablet, Take 1 mg by mouth at bedtime., Disp: , Rfl:    labetalol (NORMODYNE) 100 MG tablet, Take 1 tablet (100 mg total) by mouth 2 (two) times daily., Disp: 60 tablet, Rfl: 1   lamoTRIgine (LAMICTAL) 100 MG tablet, Take 100 mg by mouth daily., Disp: , Rfl:    ondansetron (ZOFRAN-ODT) 4 MG disintegrating tablet, Take 1 tablet (4 mg total) by mouth every 8 (eight) hours as needed for nausea or vomiting., Disp: 10 tablet, Rfl: 0   pantoprazole (PROTONIX) 40 MG tablet, Take 40 mg by mouth daily., Disp: , Rfl:    promethazine (PHENERGAN) 25 MG suppository, Place 1 suppository (25 mg total) rectally every 6 (six) hours as needed for nausea or vomiting., Disp: 12 each, Rfl: 0   spironolactone (ALDACTONE) 50 MG tablet, Take 50 mg by mouth 2 (two) times daily., Disp: , Rfl:    SUMAtriptan (IMITREX) 50 MG tablet, Take 1 tablet (50 mg total) by mouth once for 1 dose. May repeat in 2 hours if headache persists or recurs., Disp: 10 tablet, Rfl: 0  Observations/Objective: Patient is well-developed, well-nourished in no acute distress.  Resting comfortably  at home.  Head is normocephalic, atraumatic.  No labored breathing.  Speech is clear and coherent with logical content.  Patient is alert and oriented at baseline.    Assessment  and Plan: 1. Acute bacterial conjunctivitis of both eyes  - trimethoprim-polymyxin b (POLYTRIM) ophthalmic solution; Place 1 drop into both eyes every 4 (four) hours while awake for 5 days.  Dispense: 10 mL; Refill: 0  2. Acute non-recurrent pansinusitis  - sulfamethoxazole-trimethoprim (BACTRIM DS) 800-160 MG tablet; Take 1 tablet by mouth 2 (two) times daily for 7 days.  Dispense: 14 tablet; Refill: 0  3. Acute cystitis without hematuria  - sulfamethoxazole-trimethoprim (BACTRIM DS) 800-160 MG tablet; Take 1 tablet by mouth 2 (two) times daily for 7 days.  Dispense: 14 tablet; Refill: 0  4. Yeast infection  - fluconazole (DIFLUCAN) 150 MG tablet; Take 1 tablet (150 mg total) by mouth once for 1 dose. Repeat after 72 hours X2 as needed up to three doses  Dispense: 3 tablet; Refill: 0     Follow Up Instructions: I discussed the assessment and treatment plan with the patient. The patient was provided an opportunity to ask questions and all were answered. The patient agreed with the plan and demonstrated an understanding of the instructions.  A copy of instructions were sent to the patient via MyChart unless otherwise noted below.   The patient was advised to call back or seek an in-person evaluation if the symptoms worsen or if the condition fails to improve as anticipated.  Time:  I spent 15 minutes with the patient via telehealth technology discussing the above problems/concerns.    Apolonio Schneiders, FNP

## 2022-03-05 ENCOUNTER — Other Ambulatory Visit: Payer: Self-pay | Admitting: Family Medicine

## 2022-03-05 DIAGNOSIS — M8589 Other specified disorders of bone density and structure, multiple sites: Secondary | ICD-10-CM

## 2022-04-30 ENCOUNTER — Other Ambulatory Visit: Payer: BC Managed Care – PPO

## 2022-08-25 LAB — HM DEXA SCAN

## 2023-08-18 ENCOUNTER — Ambulatory Visit: Admitting: Neurology

## 2023-08-18 ENCOUNTER — Encounter: Payer: Self-pay | Admitting: Neurology

## 2023-08-18 VITALS — BP 106/62 | HR 85 | Ht 62.0 in | Wt 148.0 lb

## 2023-08-18 DIAGNOSIS — G479 Sleep disorder, unspecified: Secondary | ICD-10-CM | POA: Diagnosis not present

## 2023-08-18 DIAGNOSIS — H8190 Unspecified disorder of vestibular function, unspecified ear: Secondary | ICD-10-CM

## 2023-08-18 DIAGNOSIS — G43019 Migraine without aura, intractable, without status migrainosus: Secondary | ICD-10-CM

## 2023-08-18 DIAGNOSIS — R519 Headache, unspecified: Secondary | ICD-10-CM

## 2023-08-18 MED ORDER — AJOVY 225 MG/1.5ML ~~LOC~~ SOAJ
225.0000 mg | SUBCUTANEOUS | 3 refills | Status: DC
Start: 2023-08-18 — End: 2023-11-18

## 2023-08-18 NOTE — Patient Instructions (Signed)
 It was nice to meet you today.   As discussed, your headaches are likely due to a combination of factors.   Here is what we discussed today and my recommendations for you:   Please remember, common headache triggers are: sleep deprivation, dehydration, overheating, stress, hypoglycemia or skipping meals and blood sugar fluctuations, excessive pain medications or excessive alcohol use or caffeine withdrawal. Some people have food triggers such as aged cheese, orange juice or chocolate, especially dark chocolate, or MSG (monosodium glutamate). Try to avoid these headache triggers as much possible. It may be helpful to keep a headache diary to figure out what makes your headaches worse or brings them on and what alleviates them. Some people report headache onset after exercise but studies have shown that regular exercise may actually prevent headaches from coming. If you have exercise-induced headaches, please make sure that you drink plenty of fluid before and after exercising and that you do not over do it and do not overheat. Please limit your caffeine to 1-2 servings/day, as caffeine can drive headaches. Avoid energy drinks. Talk to your PCP about reducing or changing your Ritalin, as stimulants can cause increase in headaches and your increased migraine frequency coincides with starting Ritalin. Follow up with your ENT regarding your vertigo. We will do a brain scan, called MRI and call you with the test results. We will have to schedule you for this on a separate date. This test requires authorization from your insurance, and we will take care of the insurance process. I will order a home sleep test to look for signs of obstructive sleep apnea (aka OSA). As explained, the long-term risks and ramifications of untreated moderate to severe obstructive sleep apnea may include (but are not limited to): increased risk for cardiovascular disease, including congestive heart failure, stroke, difficult to  control hypertension, treatment resistant obesity, arrhythmias, especially irregular heartbeat commonly known as A. Fib. (atrial fibrillation); even type 2 diabetes has been linked to untreated OSA.  For headache prevention, I suggest we start you on Ajovy 225 mg/1.5 ml, it is an injection subcutaneously every 30 days. Potential side effects include: Palpitations (as in fast heartbeat), hives, itching, rash, hoarseness, irritation at the injection site, joint pain and stiffness or joint swelling, swelling of the eyelids, face, lips, hands or feet, chest tightness, trouble breathing or swallowing.  Some side effects are mild and go away as your body gets used to the new medication.  If you have any serious side effects such as bleeding or blistering or discoloration of your skin, hives, significant itching or a rash, please seek immediate medical attention by going to the ER or calling 911. Injection site reactions include itching, redness, pain at the injection site and Rare side effects include: Anaphylaxis (a severe allergic reaction), angioedema (severe swelling including around mouth and tongue). We will plan a follow up in about 3-4 months.

## 2023-08-18 NOTE — Progress Notes (Addendum)
 Subjective:    Patient ID: Tracy Harris is a 43 y.o. female.  HPI    [name], MD, PhD [facility] [address] [address] [place], [place] [address]  Dear Dr. Bari,  I saw your patient, Tracy Harris, on your kind request in my neurologic clinic today for evaluation of her recurrent headaches, concern for migraines.  The patient is unaccompanied today.  As you know, [name] is a 43 year old female with an underlying medical history of endometriosis, reflux disease, kidney stones, asthma, mood disorder, and obesity, who reports having migraines since 2023.  She reports that in the past 6 months her headache frequency has increased to up to 12/month.  She has noticed that she needs to take 2 Imitrex  at a time.  She has been started on Ritalin about 6 months ago coincidentally per PCP but about a month ago she reduced it to 20 mg daily.  She does drink caffeine daily, usually 1 serving of soda per day but occasional energy drink as well.  She endorses significant tiredness during the day.  Her Epworth sleepiness score is 17 out of 24, fatigue severity score is 48 out of 63.  Sometimes she has word finding difficulty.  She has not seen her ENT yet, she reports intermittent vertigo.  With her migraine she has associated nausea and photophobia and sonophobia.  She has an updated eye exam every year and has prescription eyeglasses.  She Reglan  about 6 months ago per her PCP.  She I reviewed your office note from 07/12/2023.  She reported having a migraine twice a week at the time, worse with weather changes.  She reported vertigo over the past 6 months.  She reported an increase in stress.  Of note, she is also on Ritalin 50 to 55 mg daily, as prescribed by PCP but she is taking a lower dose now.  In addition, she is on quetiapine  long-acting 300 milligrams each evening. She has not had much in the way of medication changes lately.  Over the course of  time she has tried multiple antidepressant medications and medications for her bipolar including Depakote, lithium, Celexa, Effexor, Lexapro, paroxetine, and sertraline. She lives with her family including husband and 3 children.  She works as a Runner, broadcasting/film/video.  She has never had a brain MRI. She denies any sudden onset one-sided weakness or numbness or tingling or droopy face or slurring of speech but does have some visual aura at times with her migraines but typically migraines are without aura. Of note, she is currently on Wellbutrin , Lamictal, spironolactone, Imitrex  as needed, Phenergan  as needed, Protonix , Celebrex and Xanax.  She had a remote head CT without contrast on 09/29/2002 with indication of brief unresponsiveness.  History of seizures.  I reviewed the results: Impression: Slight cerebral atrophy advanced for the patient's age.  Otherwise, negative. Her Past Medical History Is Significant For: Past Medical History:  Diagnosis Date   Anxiety    Bipolar 1 disorder (HCC)    Dichorionic diamniotic twin pregnancy in third trimester 05/02/2016   Endometriosis    GERD (gastroesophageal reflux disease)    History of anorexia nervosa    hospitalized age 48 and 18   History of kidney stones    History of positive PPD    Mild asthma    PONV (postoperative nausea and vomiting)    Postpartum care following cesarean delivery Indication: PROM / Di-Di Twins / Repeat (3/10) 05/02/2016   Premature rupture of  membranes 05/02/2016   Previous cesarean delivery affecting pregnancy 05/02/2016   Wears contact lenses     Her Past Surgical History Is Significant For: Past Surgical History:  Procedure Laterality Date   CESAREAN SECTION  02/11/2011   Procedure: CESAREAN SECTION;  Surgeon: Dickie DELENA Carder, MD;  Location: [facility];  Service: Gynecology;  Laterality: N/A;   CESAREAN SECTION MULTI-GESTATIONAL N/A 05/02/2016   Procedure: CESAREAN SECTION Multiple Gestation;  Surgeon: Dickie Carder, MD;   Location: [facility];  Service: Obstetrics;  Laterality: N/A;   CHOLECYSTECTOMY  2019   DX  LAPAROSCOPY/  BX LEFT OVARIAN CYST/  ABLATION AND EXCISION ENDOMETRIOSIS/ LYSIS OF ADHESIONS  03-21-2010   and chromopurtubation   LAPAROSCOPY N/A 09/20/2013   Procedure: LAPAROSCOPY LYSIS OF ADHESIONS,  EXCISION OF ENDOMETRIOSIS RIGHT OVARIAN CYSTECTOMY.;  Surgeon: Cynthia Loss, MD;  Location: [facility];  Service: Gynecology;  Laterality: N/A;   LASER ABLATION HPV , VAGINA, CERVIX, PERIANAL , AND VULVA  08-15-2002   TONSILLECTOMY  2011    Her Family History Is Significant For: Family History  Problem Relation Age of Onset   Migraines Neg Hx     Her Social History Is Significant For: Social History   Socioeconomic History   Marital status: Married    Spouse name: Not on file   Number of children: Not on file   Years of education: Not on file   Highest education level: Not on file  Occupational History   Not on file  Tobacco Use   Smoking status: Former    Current packs/day: 0.00    Average packs/day: 1 pack/day for 10.0 years (10.0 ttl pk-yrs)    Types: Cigarettes    Start date: 09/15/1993    Quit date: 09/16/2003    Years since quitting: 19.9   Smokeless tobacco: Never  Vaping Use   Vaping status: Not on file  Substance and Sexual Activity   Alcohol use: Yes    Comment: occ   Drug use: No   Sexual activity: Not Currently    Birth control/protection: None  Other Topics Concern   Not on file  Social History Narrative   Pt lives with family    Pt works    Social Drivers of Corporate investment banker Strain: Not on file  Food Insecurity: Low Risk  (02/18/2023)   Received from [facility]   Hunger Vital Sign    Within the past 12 months, you worried that your food would run out before you got money to buy more: Never true    Within the past 12 months, the food you bought just didn't last and you didn't have money to get more. : Never true   Transportation Needs: No Transportation Needs (02/18/2023)   Received from [facility]    In the past 12 months, has lack of reliable transportation kept you from medical appointments, meetings, work or from getting things needed for daily living? : No  Physical Activity: Not on file  Stress: Not on file  Social Connections: Unknown (06/22/2021)   Received from [facility]   Social Network    Social Network: Not on file    Her Allergies Are:  Allergies  Allergen Reactions   Ciprofloxacin Nausea Only   Other Swelling and Other (See Comments)    Pt is allergic to seprafilm medication used in abdomen during last laparoscopy surgery. Reaction:  Extreme abdominal swelling and pain  :   Her Current Medications Are:  Outpatient Encounter Medications as of 08/18/2023  Medication Sig   ALPRAZolam (XANAX) 0.5 MG tablet TAKE ONE TABLET BY MOUTH THREE TIMES A DAY AS NEEDED FOR SLEEP   buPROPion  (WELLBUTRIN  XL) 150 MG 24 hr tablet bupropion  HCl XL 150 mg 24 hr tablet, extended release   buPROPion  (WELLBUTRIN  XL) 300 MG 24 hr tablet Take 300 mg by mouth daily.   etonogestrel-ethinyl estradiol (NUVARING) 0.12-0.015 MG/24HR vaginal ring Place vaginally.   labetalol  (NORMODYNE ) 100 MG tablet Take 1 tablet (100 mg total) by mouth 2 (two) times daily.   lamoTRIgine (LAMICTAL) 100 MG tablet Take 100 mg by mouth daily.   ondansetron  (ZOFRAN -ODT) 4 MG disintegrating tablet Take 1 tablet (4 mg total) by mouth every 8 (eight) hours as needed for nausea or vomiting.   pantoprazole  (PROTONIX ) 40 MG tablet Take 40 mg by mouth daily.   spironolactone (ALDACTONE) 50 MG tablet Take 50 mg by mouth 2 (two) times daily.   SUMAtriptan  (IMITREX ) 50 MG tablet Take 1 tablet (50 mg total) by mouth once for 1 dose. May repeat in 2 hours if headache persists or recurs.   celecoxib (CELEBREX) 200 MG capsule Take by mouth 2 (two) times daily.   cephALEXin  (KEFLEX ) 500 MG capsule Take 1 capsule (500 mg  total) by mouth 2 (two) times daily.   fluticasone -salmeterol (ADVAIR) 250-50 MCG/ACT AEPB Inhale 1 puff into the lungs in the morning and at bedtime.   folic acid (FOLVITE) 1 MG tablet Take 1 mg by mouth at bedtime.   promethazine  (PHENERGAN ) 25 MG suppository Place 1 suppository (25 mg total) rectally every 6 (six) hours as needed for nausea or vomiting.   No facility-administered encounter medications on file as of 08/18/2023.  :   Review of Systems:  Out of a complete 14 point review of systems, all are reviewed and negative with the exception of these symptoms as listed below:  Review of Systems  Neurological:        Pt here for migraines Pt states 12 migraines on last month Pt wants to discuss preventive medication.     Objective:  Neurological Exam  Physical Exam Physical Examination:   Vitals:   08/18/23 0818  BP: 106/62  Pulse: 85    General Examination: The patient is a very pleasant 43 y.o. female in no acute distress. She appears well-developed and well-nourished and well groomed.   HEENT: Normocephalic, atraumatic, pupils are equal, round and reactive to light, no photophobia, funduscopic exam benign.  Extraocular tracking is good without limitation to gaze excursion or nystagmus noted. Hearing is grossly intact, bilateral hearing aids in place. Face is symmetric with normal facial animation and normal facial sensation to light touch, temperature and vibration sense. Speech is clear with no dysarthria noted. There is no hypophonia. There is no lip, neck/head, jaw or voice tremor. Neck is supple with full range of passive and active motion. There are no carotid bruits on auscultation. Oropharynx exam reveals: mild mouth dryness, good dental hygiene and mild airway crowding, due to small airway entry.  Mallampati class I, neck size of 13 cm, tonsils absent.  Tongue protrudes centrally and palate elevates symmetrically.  Chest: Clear to auscultation without wheezing, rhonchi  or crackles noted.  Heart: S1+S2+0, regular and normal without murmurs, rubs or gallops noted.   Abdomen: Soft, non-tender and non-distended.  Extremities: There is no pitting edema in the distal lower extremities bilaterally.   Skin: Warm and dry without trophic changes noted.   Musculoskeletal: exam reveals  no obvious joint deformities.   Neurologically:  Mental status: The patient is awake, alert and oriented in all 4 spheres. Her immediate and remote memory, attention, language skills and fund of knowledge are appropriate. There is no evidence of aphasia, agnosia, apraxia or anomia. Speech is clear with normal prosody and enunciation. Thought process is linear. Mood is normal and affect is normal.  Cranial nerves II - XII are as described above under HEENT exam.  Motor exam: Normal bulk, strength and tone is noted. There is no obvious action or resting tremor.  No drift or rebound, no postural or intention tremor. Fine motor skills and coordination: Normal finger taps, hand movements and rapid alternating patting in both upper extremities, normal foot taps bilaterally in the lower extremities.    Cerebellar testing: No dysmetria or intention tremor. There is no truncal or gait ataxia.  Normal finger-to-nose, normal heel-to-shin bilaterally. Sensory exam: intact to light touch, temperature and vibration sense in the upper and lower extremities.  Reflexes 2+ throughout, toes are downgoing bilaterally. Gait, station and balance: She stands easily. No veering to one side is noted. No leaning to one side is noted. Posture is age-appropriate and stance is narrow based. Gait shows normal stride length and normal pace. No problems turning are noted.  Normal tandem walk, Romberg negative.  Assessment and Plan:  In summary, Chowan PAONE is a very pleasant 43 y.o.-year old female with an underlying medical history of endometriosis, reflux disease, kidney stones, asthma, mood disorder, and  obesity, who presents for evaluation of her recurrent headaches of approximately 2 years duration with worsening reported in the past 6 months.  Her headache frequency increased coincidently after she started Ritalin and she is advised that stimulant medications can certainly make headaches worse.  We talked about headache triggers at length today.  She is advised to stay well-hydrated and more rested and proceed with additional testing at this time including a brain MRI and a home sleep test to evaluate her for obstructive sleep apnea.  If she has obstructive sleep apnea, it may tie in with some of her cognitive concerns and her headaches as well.  We will consider treatment with AutoPap therapy at the time.  She is reminded to limit her caffeine and avoid any energy drinks as this can also drive her headaches.  She is advised to follow-up with her PCP regarding her Ritalin.  I would like to start her on migraine prevention in the form of Ajovy once monthly injections subcutaneously.  She has tried and failed multiple medications already in the past that are used for migraine prevention.  She is not a good candidate for a beta-blocker due to her depression and lower normal blood pressure values.  She is not a good candidate for Topamax due to her cognitive complaints.  Her neurological exam is nonfocal and she is reassured.  She is advised to follow-up routinely in this clinic in about 3 months, sooner if needed.  I answered all her questions today and she was in agreement.  This was an extended visit of over 60 minutes with copious record review involved, considerable counseling and coordination of care and addressing multiple issues.   Thank you very much for allowing me to participate in the care of this nice patient. If I can be of any further assistance to you please do not hesitate to call me at [phone].  Sincerely,   [name], MD, PhD

## 2023-08-24 ENCOUNTER — Encounter: Payer: Self-pay | Admitting: Neurology

## 2023-08-24 ENCOUNTER — Ambulatory Visit: Payer: Self-pay | Admitting: Neurology

## 2023-08-24 ENCOUNTER — Ambulatory Visit

## 2023-08-24 DIAGNOSIS — G479 Sleep disorder, unspecified: Secondary | ICD-10-CM | POA: Diagnosis not present

## 2023-08-24 DIAGNOSIS — H8190 Unspecified disorder of vestibular function, unspecified ear: Secondary | ICD-10-CM

## 2023-08-24 DIAGNOSIS — R519 Headache, unspecified: Secondary | ICD-10-CM

## 2023-08-24 DIAGNOSIS — G43019 Migraine without aura, intractable, without status migrainosus: Secondary | ICD-10-CM

## 2023-08-24 MED ORDER — GADOBENATE DIMEGLUMINE 529 MG/ML IV SOLN
13.0000 mL | Freq: Once | INTRAVENOUS | Status: AC | PRN
  Administered 2023-08-24: 13 mL via INTRAVENOUS

## 2023-08-25 ENCOUNTER — Telehealth: Payer: Self-pay | Admitting: *Deleted

## 2023-08-25 ENCOUNTER — Telehealth: Payer: Self-pay

## 2023-08-25 ENCOUNTER — Other Ambulatory Visit (HOSPITAL_COMMUNITY): Payer: Self-pay

## 2023-08-25 NOTE — Telephone Encounter (Signed)
 Sent PA Ajovy  to PA team this morning

## 2023-08-25 NOTE — Telephone Encounter (Signed)
 noted

## 2023-08-25 NOTE — Telephone Encounter (Signed)
 Pharmacy Patient Advocate Encounter   Received notification from Physician's Office that prior authorization for AJOVY  (fremanezumab -vfrm) injection 225MG /1.5ML auto-injectors is required/requested.   Insurance verification completed.   The patient is insured through [facility] .   Per test claim: PA required; PA submitted to above mentioned insurance via CoverMyMeds Key/confirmation #/EOC [id number] Status is pending

## 2023-08-25 NOTE — Telephone Encounter (Signed)
 Pharmacy Patient Advocate Encounter  Received notification from [facility] that Prior Authorization for AJOVY  (fremanezumab -vfrm) injection 225MG /1.5ML auto-injectors has been APPROVED from 08/25/2023 to 11/25/2023. Unable to obtain price due to refill too soon rejection, last fill date 08/25/2023 next available fill date7/23/2025   PA #/Case ID/Reference #: PA Case ID #: [id number]

## 2023-09-03 ENCOUNTER — Ambulatory Visit: Admitting: Neurology

## 2023-09-03 DIAGNOSIS — G4733 Obstructive sleep apnea (adult) (pediatric): Secondary | ICD-10-CM | POA: Diagnosis not present

## 2023-09-03 DIAGNOSIS — G479 Sleep disorder, unspecified: Secondary | ICD-10-CM

## 2023-09-03 DIAGNOSIS — R519 Headache, unspecified: Secondary | ICD-10-CM

## 2023-09-03 DIAGNOSIS — G43019 Migraine without aura, intractable, without status migrainosus: Secondary | ICD-10-CM

## 2023-09-03 DIAGNOSIS — H8190 Unspecified disorder of vestibular function, unspecified ear: Secondary | ICD-10-CM

## 2023-09-03 DIAGNOSIS — R0683 Snoring: Secondary | ICD-10-CM

## 2023-09-16 NOTE — Progress Notes (Unsigned)
 See procedure note.

## 2023-09-17 NOTE — Procedures (Signed)
   [facility]  HOME SLEEP TEST (SANSA) REPORT (Mail-Out Device):   STUDY DATE: 09/12/2023  DOB: 01/19/1981  MRN: [id number]  ORDERING CLINICIAN: [name], MD, PhD   REFERRING CLINICIAN: Dr. Theodoro Sax  CLINICAL INFORMATION/HISTORY: 43 year old female with an underlying medical history of endometriosis, reflux disease, kidney stones, asthma, mood disorder, and obesity, who reports migraine headaches.  She reports significant daytime somnolence.    PATIENT'S LAST REPORTED EPWORTH SLEEPINESS SCORE (ESS): 17/24.  BMI (at the time of sleep clinic visit and/or test date): 27.1 kg/m  FINDINGS:   Study Protocol:    The SANSA single-point-of-skin-contact chest-worn sensor - an FDA cleared and DOT approved type 4 home sleep test device - measures eight physiological channels,  including blood oxygen saturation (measured via PPG [photoplethysmography]), EKG-derived heart rate, respiratory effort, chest movement (measured via accelerometer), snoring, body position, and actigraphy. The device is designed to be worn for up to 10 hours per study.   Sleep Summary:   Total Recording Time (hours, min): 9 hours, 53 min  Total Effective Sleep Time (hours, min):  8 hours, 15 min  Sleep Efficiency (%):    91%   Respiratory Indices:   Calculated sAHI (per hour):  3/hour         Oxygen Saturation Statistics:    Oxygen Saturation (%) Mean: 97.9%   Minimum oxygen saturation (%):                 87.6%   O2 Saturation Range (%): 87.6- 100%   Time below or at 88% saturation: 0 min   Pulse Rate Statistics:   Pulse Mean (bpm):    78/min    Pulse Range (55 -103/min)   Snoring: Mild to moderate  IMPRESSION/DIAGNOSES:   Primary snoring   RECOMMENDATIONS:   This home sleep test does not demonstrate any significant obstructive or central sleep disordered breathing with a total AHI of less than 5/hour. Her total AHI was 3.0/hour, O2 nadir of 87.6%.  Snoring was  detected, in the mild to moderate range. Treatment with a positive airway pressure device such as AutoPap or CPAP is not indicated based on this test. Snoring may improve with avoidance of the supine sleep position and weight loss (where clinically appropriate).   For disturbing snoring, an oral appliance through dentistry or orthodontics can be considered.  Other causes of the patient's symptoms, including circadian rhythm disturbances, an underlying mood disorder, medication effect and/or an underlying medical problem cannot be ruled out based on this test. Clinical correlation is recommended.  The patient should be cautioned not to drive, work at heights, or operate dangerous or heavy equipment when tired or sleepy. Review and reiteration of good sleep hygiene measures should be pursued with any patient. The patient will be advised to follow up with her referring provider, who will be notified of the test results.   I certify that I have reviewed the raw data recording prior to the issuance of this report in accordance with the standards of the American Academy of Sleep Medicine (AASM).    INTERPRETING PHYSICIAN:   [name], MD, PhD Medical Director, [facility]) Diplomat, ABPN (Neurology and Sleep)   [facility] [address] [place], [place] [address] [phone]

## 2023-09-21 NOTE — Telephone Encounter (Signed)
 Spoke with patient. She had not seen the mychart message from Dr Buck yet. I went over her sleep study results with her. She would like to pass on the oral appliance for now. Will see Dr Buck for migraine f/u in September.

## 2023-09-21 NOTE — Telephone Encounter (Signed)
-----   Message from [name] sent at 09/17/2023  1:19 PM EDT ----- See MyChart message to patient, FYI.  ----- Message ----- From: Mar True, MD Sent: 09/17/2023   1:18 PM EDT To: [name], MD

## 2023-10-27 ENCOUNTER — Telehealth: Payer: Self-pay

## 2023-10-27 ENCOUNTER — Other Ambulatory Visit (HOSPITAL_COMMUNITY): Payer: Self-pay

## 2023-10-27 NOTE — Telephone Encounter (Signed)
   It is time to renew PA-insurance requesting documentation of how its working for the PT-Please advise.

## 2023-10-27 NOTE — Telephone Encounter (Signed)
 Pt is returning call , Pt states Medication is working well  have not had any Migraines , Informred Pt that Nurse will give call back .

## 2023-10-27 NOTE — Telephone Encounter (Signed)
 LMVM for pt to call back, needing to answer some questions about ajovy  if working.

## 2023-10-28 ENCOUNTER — Other Ambulatory Visit (HOSPITAL_COMMUNITY): Payer: Self-pay

## 2023-10-28 NOTE — Telephone Encounter (Signed)
 Pharmacy Patient Advocate Encounter   Received notification from Fax that prior authorization for Ajovy  is required/requested.   Insurance verification completed.   The patient is insured through [facility] .   Per test claim: PA required; PA submitted to above mentioned insurance via Latent Key/confirmation #/EOC [facility] Status is pending

## 2023-10-28 NOTE — Telephone Encounter (Signed)
 Pharmacy Patient Advocate Encounter  Received notification from [facility] that Prior Authorization for Ajovy  has been APPROVED from 10/28/2023 to 10/27/2024. Ran test claim, Copay is $24.98. This test claim was processed through [facility]- copay amounts may vary at other pharmacies due to pharmacy/plan contracts, or as the patient moves through the different stages of their insurance plan.   PA #/Case ID/Reference #: [id number]

## 2023-11-18 ENCOUNTER — Encounter: Payer: Self-pay | Admitting: Neurology

## 2023-11-18 ENCOUNTER — Ambulatory Visit: Admitting: Neurology

## 2023-11-18 VITALS — BP 101/67 | HR 76 | Ht 62.0 in | Wt 146.0 lb

## 2023-11-18 DIAGNOSIS — G43019 Migraine without aura, intractable, without status migrainosus: Secondary | ICD-10-CM

## 2023-11-18 DIAGNOSIS — Z87898 Personal history of other specified conditions: Secondary | ICD-10-CM

## 2023-11-18 DIAGNOSIS — G43009 Migraine without aura, not intractable, without status migrainosus: Secondary | ICD-10-CM | POA: Diagnosis not present

## 2023-11-18 MED ORDER — AJOVY 225 MG/1.5ML ~~LOC~~ SOAJ: 225.0000 mg | SUBCUTANEOUS | 5 refills | Status: AC

## 2023-11-18 MED ORDER — SUMATRIPTAN SUCCINATE 50 MG PO TABS: 50.0000 mg | ORAL_TABLET | Freq: Once | ORAL | 3 refills | Status: DC | PRN

## 2023-11-18 NOTE — Progress Notes (Signed)
 Subjective:    Patient ID: Tracy Harris is a 43 y.o. female.  HPI    [name], MD, PhD [facility] [address], [address] [address] [place], [place] [address]  Tracy Harris is a 43 year old female with an underlying medical history of endometriosis, reflux disease, kidney stones, asthma, mood disorder, and obesity, who presents for follow-up consultation of her migraine headaches.  The patient is unaccompanied today.  I first met her at the request of Dr. Bari on 08/18/2023, at which time the patient reported an approximately 2-year history of recurrent migraines.  She was on Imitrex  as needed. She was advised to start Ajovy  injections monthly for migraine prevention.  We talked about headache triggers and exacerbating factors including caffeine and stimulants.  She was advised to proceed with additional testing in the form of home sleep test and brain scan.  She had a home sleep test through our office on 09/12/2023 which showed no significant obstructive sleep apnea with an AHI of 3/h, O2 nadir briefly at 87.6% with mild to moderate snoring detected.  No significant time below or at 88% saturation for the night, of less than 1 minute.   She was advised to proceed with a brain MRI.  She had a brain MRI with and without contrast on 08/24/2023 and I reviewed the results: Impression: Normal MRI scan of the brain with and without contrast. In addition, I personally and independently reviewed images through the PACS system.  Today, 11/18/2023: She reports doing a lot better with the Ajovy  injections.  She has had 3 injections thus far and is due early next month.  She has not had any side effects but it was intimidating at first to do the injection.  She is wondering if she can still take Imitrex  as needed.  She is working with her providers to get down on some of her other medications, is still on Ritalin about 20 mg each day on average and has reduced her Seroquel   from 600 mg to currently 450 mg daily.  She is hydrating well with water.  She limits her caffeine, she drinks a protein milkshake with some caffeine in it and adds 1 shot of espresso to it.  She is up-to-date with her eye examination, she has a prescription for new eyeglasses which she has not filled yet.  She had no recent vertigo episode but has not seen ENT in the recent past, will talk to her PCP about a referral to ENT, especially in light of prolonged vertigo episodes in the past and also history of bilateral hearing loss and hearing aids.  Previously:   08/18/2023: (She) reports having migraines since 2023.  She reports that in the past 6 months her headache frequency has increased to up to 12/month.  She has noticed that she needs to take 2 Imitrex  at a time.  She has been started on Ritalin about 6 months ago coincidentally per PCP but about a month ago she reduced it to 20 mg daily.  She does drink caffeine daily, usually 1 serving of soda per day but occasional energy drink as well.  She endorses significant tiredness during the day.  Her Epworth sleepiness score is 17 out of 24, fatigue severity score is 48 out of 63.  Sometimes she has word finding difficulty.  She has not seen her ENT yet, she reports intermittent vertigo.  With her migraine she has associated nausea and photophobia and sonophobia.  She has an updated eye  exam every year and has prescription eyeglasses.  She took Reglan  about 6 months ago per her PCP.  She I reviewed your office note from 07/12/2023.  She reported having a migraine twice a week at the time, worse with weather changes.  She reported vertigo over the past 6 months.  She reported an increase in stress.  Of note, she is also on Ritalin 50 to 55 mg daily, as prescribed by PCP but she is taking a lower dose now.  In addition, she is on quetiapine  long-acting 300 milligrams each evening. She has not had much in the way of medication changes lately.  Over the course of  time she has tried multiple antidepressant medications and medications for her bipolar including Depakote, lithium, Celexa, Effexor, Lexapro, paroxetine, and sertraline. She lives with her family including husband and 3 children.  She works as a Runner, broadcasting/film/video.  She has never had a brain MRI. She denies any sudden onset one-sided weakness or numbness or tingling or droopy face or slurring of speech but does have some visual aura at times with her migraines but typically migraines are without aura. Of note, she is currently on Wellbutrin , Lamictal, spironolactone, Imitrex  as needed, Phenergan  as needed, Protonix , Celebrex and Xanax.   She had a remote head CT without contrast on 09/29/2002 with indication of brief unresponsiveness.  History of seizures.  I reviewed the results: Impression: Slight cerebral atrophy advanced for the patient's age.  Otherwise, negative.   Her Past Medical History Is Significant For: Past Medical History:  Diagnosis Date   Anxiety    Bipolar 1 disorder (HCC)    Dichorionic diamniotic twin pregnancy in third trimester 05/02/2016   Endometriosis    GERD (gastroesophageal reflux disease)    History of anorexia nervosa    hospitalized age 68 and 11   History of kidney stones    History of positive PPD    Mild asthma    PONV (postoperative nausea and vomiting)    Postpartum care following cesarean delivery Indication: PROM / Di-Di Twins / Repeat (3/10) 05/02/2016   Premature rupture of membranes 05/02/2016   Previous cesarean delivery affecting pregnancy 05/02/2016   Wears contact lenses     Her Past Surgical History Is Significant For: Past Surgical History:  Procedure Laterality Date   CESAREAN SECTION  02/11/2011   Procedure: CESAREAN SECTION;  Surgeon: Dickie DELENA Carder, MD;  Location: [facility];  Service: Gynecology;  Laterality: N/A;   CESAREAN SECTION MULTI-GESTATIONAL N/A 05/02/2016   Procedure: CESAREAN SECTION Multiple Gestation;  Surgeon: Dickie Carder, MD;   Location: [facility];  Service: Obstetrics;  Laterality: N/A;   CHOLECYSTECTOMY  2019   DX  LAPAROSCOPY/  BX LEFT OVARIAN CYST/  ABLATION AND EXCISION ENDOMETRIOSIS/ LYSIS OF ADHESIONS  03-21-2010   and chromopurtubation   LAPAROSCOPY N/A 09/20/2013   Procedure: LAPAROSCOPY LYSIS OF ADHESIONS,  EXCISION OF ENDOMETRIOSIS RIGHT OVARIAN CYSTECTOMY.;  Surgeon: Cynthia Loss, MD;  Location: [facility];  Service: Gynecology;  Laterality: N/A;   LASER ABLATION HPV , VAGINA, CERVIX, PERIANAL , AND VULVA  08-15-2002   TONSILLECTOMY  2011    Her Family History Is Significant For: Family History  Problem Relation Age of Onset   Migraines Neg Hx     Her Social History Is Significant For: Social History   Socioeconomic History   Marital status: Married    Spouse name: Not on file   Number of children: Not on file   Years of education: Not  on file   Highest education level: Not on file  Occupational History   Not on file  Tobacco Use   Smoking status: Former    Current packs/day: 0.00    Average packs/day: 1 pack/day for 10.0 years (10.0 ttl pk-yrs)    Types: Cigarettes    Start date: 09/15/1993    Quit date: 09/16/2003    Years since quitting: 20.1   Smokeless tobacco: Never  Vaping Use   Vaping status: Not on file  Substance and Sexual Activity   Alcohol use: Yes    Comment: occ   Drug use: No   Sexual activity: Not Currently    Birth control/protection: None  Other Topics Concern   Not on file  Social History Narrative   Pt lives with family    Pt works    Social Drivers of Corporate investment banker Strain: Not on file  Food Insecurity: Low Risk  (02/18/2023)   Received from [facility]   Hunger Vital Sign    Within the past 12 months, you worried that your food would run out before you got money to buy more: Never true    Within the past 12 months, the food you bought just didn't last and you didn't have money to get more. : Never true   Transportation Needs: No Transportation Needs (02/18/2023)   Received from [facility]    In the past 12 months, has lack of reliable transportation kept you from medical appointments, meetings, work or from getting things needed for daily living? : No  Physical Activity: Not on file  Stress: Not on file  Social Connections: Unknown (06/22/2021)   Received from [facility]   Social Network    Social Network: Not on file    Her Allergies Are:  Allergies  Allergen Reactions   Ciprofloxacin Nausea Only   Other Swelling and Other (See Comments)    Pt is allergic to seprafilm medication used in abdomen during last laparoscopy surgery. Reaction:  Extreme abdominal swelling and pain  :   Her Current Medications Are:  Outpatient Encounter Medications as of 11/18/2023  Medication Sig   ALPRAZolam (XANAX) 0.5 MG tablet TAKE ONE TABLET BY MOUTH THREE TIMES A DAY AS NEEDED FOR SLEEP   buPROPion  (WELLBUTRIN  XL) 150 MG 24 hr tablet bupropion  HCl XL 150 mg 24 hr tablet, extended release   buPROPion  (WELLBUTRIN  XL) 300 MG 24 hr tablet Take 300 mg by mouth daily.   celecoxib (CELEBREX) 200 MG capsule Take by mouth 2 (two) times daily. (Patient taking differently: Take by mouth as needed.)   cephALEXin  (KEFLEX ) 500 MG capsule Take 1 capsule (500 mg total) by mouth 2 (two) times daily.   etonogestrel-ethinyl estradiol (NUVARING) 0.12-0.015 MG/24HR vaginal ring Place vaginally.   Fremanezumab -vfrm (AJOVY ) 225 MG/1.5ML SOAJ Inject 225 mg into the skin every 30 (thirty) days.   lamoTRIgine (LAMICTAL) 100 MG tablet Take 100 mg by mouth daily.   ondansetron  (ZOFRAN -ODT) 4 MG disintegrating tablet Take 1 tablet (4 mg total) by mouth every 8 (eight) hours as needed for nausea or vomiting.   pantoprazole  (PROTONIX ) 40 MG tablet Take 40 mg by mouth daily.   QUEtiapine  (SEROQUEL  XR) 300 MG 24 hr tablet Take 600 mg by mouth at bedtime. (Patient taking differently: Take 450 mg by mouth at  bedtime.)   spironolactone (ALDACTONE) 50 MG tablet Take 50 mg by mouth 2 (two) times daily.   SUMAtriptan  (IMITREX ) 50 MG tablet Take 1  tablet (50 mg total) by mouth once for 1 dose. May repeat in 2 hours if headache persists or recurs.   fluticasone -salmeterol (ADVAIR) 250-50 MCG/ACT AEPB Inhale 1 puff into the lungs in the morning and at bedtime.   folic acid (FOLVITE) 1 MG tablet Take 1 mg by mouth at bedtime.   labetalol  (NORMODYNE ) 100 MG tablet Take 1 tablet (100 mg total) by mouth 2 (two) times daily.   promethazine  (PHENERGAN ) 25 MG suppository Place 1 suppository (25 mg total) rectally every 6 (six) hours as needed for nausea or vomiting.   No facility-administered encounter medications on file as of 11/18/2023.  :   Review of Systems:  Out of a complete 14 point review of systems, all are reviewed and negative with the exception of these symptoms as listed below:    Review of Systems  Neurological:        Pt here for Migraine f/u Pt states 1 migraine in last month     Objective:  Neurological Exam  Physical Exam Physical Examination:   Vitals:   11/18/23 1318  BP: 101/67  Pulse: 76    General Examination: The patient is a very pleasant 43 y.o. female in no acute distress. She appears well-developed and well-nourished and well groomed.   HEENT: Normocephalic, atraumatic, pupils are equal, round and reactive to light, no photophobia. Extraocular tracking is good without limitation to gaze excursion or nystagmus noted.  Corrective eyeglasses in place.  Hearing is grossly intact, bilateral hearing aids in place. Face is symmetric with normal facial animation and normal facial sensation to light touch, temperature and vibration sense. Speech is clear with no dysarthria noted. There is no hypophonia. There is no lip, neck/head, jaw or voice tremor. Neck is supple with full range of passive and active motion. There are no carotid bruits on auscultation. Oropharynx exam reveals:  mild mouth dryness, otherwise stable findings.   Chest: Clear to auscultation without wheezing, rhonchi or crackles noted.   Heart: S1+S2+0, regular and normal without murmurs, rubs or gallops noted.    Abdomen: Soft, non-tender and non-distended.   Extremities: There is no pitting edema in the distal lower extremities bilaterally.    Skin: Warm and dry without trophic changes noted.    Musculoskeletal: exam reveals no obvious joint deformities.    Neurologically:  Mental status: The patient is awake, alert and oriented in all 4 spheres. Her immediate and remote memory, attention, language skills and fund of knowledge are appropriate. There is no evidence of aphasia, agnosia, apraxia or anomia. Speech is clear with normal prosody and enunciation. Thought process is linear. Mood is normal and affect is normal.  Cranial nerves II - XII are as described above under HEENT exam.  Motor exam: Normal bulk, strength and tone is noted. There is no obvious action or resting tremor.  No drift or rebound, no postural or intention tremor. Fine motor skills and coordination: Normal finger taps, hand movements and rapid alternating patting in both upper extremities, normal foot taps bilaterally in the lower extremities.     Cerebellar testing: No dysmetria or intention tremor. There is no truncal or gait ataxia.  Normal finger-to-nose, normal heel-to-shin bilaterally. Sensory exam: intact to light touch in the upper and lower extremities.  Gait, station and balance: She stands easily. No veering to one side is noted. No leaning to one side is noted. Posture is age-appropriate and stance is narrow based. Gait shows normal stride length and normal pace. No problems turning are  noted.     Assessment and Plan:  In summary, Tracy Harris is a very pleasant 43 year old female with an underlying medical history of vertigo, hearing loss, endometriosis, reflux disease, kidney stones, asthma, mood disorder,  and obesity, who presents for follow-up consultation of her migraine headaches.  She had benign testing in the recent past including brain MRI and home sleep test.  Neurological exam is nonfocal, no recent vertigo.  She has not seen ENT and is encouraged to talk to her PCP about seeing ENT for prolonged vertigo in the past.  She also has hearing loss and requires bilateral hearing aids.  She has done well with Ajovy  injections in the past 3 months.  She is quite pleased with her results thus far.  She can continue to use Imitrex  as needed.  I renewed her prescriptions for Ajovy  and Imitrex  at this time.  She also had a recent updated eye examination and is pending new eyeglasses but the number has not changed much she states.  We talked about headache triggers and alleviating factors again today. She is advised to follow-up routinely in this clinic to see one of our nurse practitioners in about 6 to 8 months, sooner if needed.  I answered all her questions today and she was in agreement.   I spent 30 minutes in total face-to-face time and in reviewing records during pre-charting, more than 50% of which was spent in counseling and coordination of care, reviewing test results, reviewing medications and treatment regimen and/or in discussing or reviewing the diagnosis of migraine headaches, the prognosis and treatment options. Pertinent laboratory and imaging test results that were available during this visit with the patient were reviewed by me and considered in my medical decision making (see chart for details).

## 2023-11-18 NOTE — Patient Instructions (Signed)
 Please talk to your PCP about seeing ENT as you have had prolonged vertigo in the past and also have hearing loss requiring hearing aids.  Continue with your current medication regimen including Ajovy  injections monthly and as needed use of Imitrex .

## 2023-11-29 ENCOUNTER — Ambulatory Visit: Admitting: Neurology

## 2024-01-12 ENCOUNTER — Encounter: Admitting: Gastroenterology

## 2024-01-19 ENCOUNTER — Encounter: Admitting: Gastroenterology

## 2024-03-18 ENCOUNTER — Other Ambulatory Visit: Payer: Self-pay | Admitting: Neurology

## 2024-03-18 DIAGNOSIS — G43009 Migraine without aura, not intractable, without status migrainosus: Secondary | ICD-10-CM

## 2024-06-12 ENCOUNTER — Ambulatory Visit: Admitting: Adult Health
